# Patient Record
Sex: Female | Born: 1940 | Race: White | Hispanic: No | Marital: Married | Smoking: Never smoker
Health system: Southern US, Community
[De-identification: ages and names within clinical notes are randomized; demographics above are authoritative.]

## PROBLEM LIST (undated history)

## (undated) DIAGNOSIS — E7800 Pure hypercholesterolemia, unspecified: Secondary | ICD-10-CM

## (undated) DIAGNOSIS — R32 Unspecified urinary incontinence: Secondary | ICD-10-CM

## (undated) DIAGNOSIS — M810 Age-related osteoporosis without current pathological fracture: Secondary | ICD-10-CM

## (undated) DIAGNOSIS — D126 Benign neoplasm of colon, unspecified: Secondary | ICD-10-CM

## (undated) DIAGNOSIS — C801 Malignant (primary) neoplasm, unspecified: Secondary | ICD-10-CM

## (undated) DIAGNOSIS — Z960 Presence of urogenital implants: Secondary | ICD-10-CM

## (undated) DIAGNOSIS — R0789 Other chest pain: Secondary | ICD-10-CM

## (undated) HISTORY — PX: BREAST EXCISIONAL BIOPSY: SUR124

## (undated) HISTORY — PX: BREAST BIOPSY: SHX20

## (undated) HISTORY — PX: CATARACT EXTRACTION: SUR2

## (undated) HISTORY — PX: COLONOSCOPY: SHX174

## (undated) HISTORY — PX: TUBAL LIGATION: SHX77

## (undated) HISTORY — DX: Age-related osteoporosis without current pathological fracture: M81.0

---

## 1999-01-01 ENCOUNTER — Other Ambulatory Visit: Admission: RE | Admit: 1999-01-01 | Discharge: 1999-01-01 | Payer: Self-pay

## 2004-07-31 ENCOUNTER — Ambulatory Visit: Payer: Self-pay | Admitting: Internal Medicine

## 2005-08-12 ENCOUNTER — Ambulatory Visit: Payer: Self-pay | Admitting: Internal Medicine

## 2005-09-04 ENCOUNTER — Ambulatory Visit: Payer: Self-pay | Admitting: Internal Medicine

## 2006-09-29 ENCOUNTER — Ambulatory Visit: Payer: Self-pay | Admitting: Internal Medicine

## 2007-11-13 ENCOUNTER — Ambulatory Visit: Payer: Self-pay | Admitting: Internal Medicine

## 2008-07-12 ENCOUNTER — Ambulatory Visit: Payer: Self-pay | Admitting: Surgery

## 2008-11-14 ENCOUNTER — Ambulatory Visit: Payer: Self-pay | Admitting: Family Medicine

## 2009-11-16 ENCOUNTER — Ambulatory Visit: Payer: Self-pay | Admitting: Family Medicine

## 2010-11-20 ENCOUNTER — Ambulatory Visit: Payer: Self-pay | Admitting: Internal Medicine

## 2011-11-26 ENCOUNTER — Ambulatory Visit: Payer: Self-pay | Admitting: Internal Medicine

## 2012-10-15 ENCOUNTER — Ambulatory Visit: Payer: Self-pay | Admitting: Unknown Physician Specialty

## 2012-12-03 ENCOUNTER — Ambulatory Visit: Payer: Self-pay | Admitting: Internal Medicine

## 2013-12-07 ENCOUNTER — Ambulatory Visit: Payer: Self-pay | Admitting: Internal Medicine

## 2014-02-07 DIAGNOSIS — E785 Hyperlipidemia, unspecified: Secondary | ICD-10-CM | POA: Insufficient documentation

## 2014-02-07 DIAGNOSIS — R030 Elevated blood-pressure reading, without diagnosis of hypertension: Secondary | ICD-10-CM | POA: Insufficient documentation

## 2014-12-20 ENCOUNTER — Ambulatory Visit: Payer: Self-pay | Admitting: Pediatrics

## 2015-12-26 ENCOUNTER — Other Ambulatory Visit: Payer: Self-pay | Admitting: Pediatrics

## 2015-12-26 DIAGNOSIS — Z1231 Encounter for screening mammogram for malignant neoplasm of breast: Secondary | ICD-10-CM

## 2015-12-27 ENCOUNTER — Ambulatory Visit
Admission: RE | Admit: 2015-12-27 | Discharge: 2015-12-27 | Disposition: A | Discharge status: Home / Self Care | Payer: Medicare Other | Source: Ambulatory Visit | Attending: Pediatrics | Admitting: Pediatrics

## 2015-12-27 DIAGNOSIS — Z1231 Encounter for screening mammogram for malignant neoplasm of breast: Secondary | ICD-10-CM | POA: Diagnosis not present

## 2015-12-27 HISTORY — DX: Malignant (primary) neoplasm, unspecified: C80.1

## 2016-03-26 ENCOUNTER — Ambulatory Visit: Payer: Self-pay | Admitting: Podiatry

## 2016-04-02 ENCOUNTER — Ambulatory Visit (INDEPENDENT_AMBULATORY_CARE_PROVIDER_SITE_OTHER): Payer: Medicare Other

## 2016-04-02 ENCOUNTER — Ambulatory Visit (INDEPENDENT_AMBULATORY_CARE_PROVIDER_SITE_OTHER): Payer: Medicare Other | Admitting: Podiatry

## 2016-04-02 DIAGNOSIS — M205X9 Other deformities of toe(s) (acquired), unspecified foot: Secondary | ICD-10-CM

## 2016-04-02 DIAGNOSIS — M792 Neuralgia and neuritis, unspecified: Secondary | ICD-10-CM

## 2016-04-02 DIAGNOSIS — R52 Pain, unspecified: Secondary | ICD-10-CM

## 2016-04-02 DIAGNOSIS — B351 Tinea unguium: Secondary | ICD-10-CM

## 2016-04-02 NOTE — Progress Notes (Signed)
   Subjective:    Patient ID: Renee Chen, female    DOB: [birth year], 75 y.o.   MRN: [id number]  HPI  75 year old female presents the office today as she may be appointment originally for toenail fungus. She is in no recent treatment. She does state that her right big toenail to come off last week and denies any drainage or pus or any swelling to the toe. No injury to the toe. She also states that more recently she started noticing numbness to her fourth toe. She previously had this on the left foot and she had a pins nerve and she had surgery. She gets some occasional pain in that area still however. No recent injury or trauma to her feet. No swelling or redness. No recent treatment. No other complaints.  Review of Systems  All other systems reviewed and are negative.      Objective:   Physical Exam General: AAO x3, NAD  Dermatological: Nails appear to be hypertrophic, dystrophic, somewhat discolored except for the right hallux toenail which has recently fallen off and there is underlying healthy skin and no wound present. There is no tenderness the nails there is no surrounding redness or drainage. No open lesion is identified at this time.  Vascular: Dorsalis Pedis artery and Posterior Tibial artery pedal pulses are 2/4 bilateral with immedate capillary fill time. Pedal hair growth present. There is no pain with calf compression, swelling, warmth, erythema.   Neruologic: Grossly intact via light touch bilateral. Vibratory intact via tuning fork bilateral. Protective threshold with Semmes Wienstein monofilament intact to all pedal sites bilateral..   Musculoskeletal: Adductovarus of the fourth and fifth toes bilaterally. There is no palpable neuroma identified at this time there is no pain on the interspaces. She is currently not expansion any numbness or tingling to her toes. There is prominence the metatarsal heads plantarly. No pain, crepitus, or limitation noted with foot and  ankle range of motion bilateral. Muscular strength 5/5 in all groups tested bilateral.  Gait: Unassisted, Nonantalgic.      Assessment & Plan:  75 year old female with right fourth toe numbness likely biomechanical in changes, likely onychomycosis -Treatment options discussed including all alternatives, risks, and complications -Etiology of symptoms were discussed -X-rays were obtained and reviewed with the patient. No evidence of acute fracture. Hammertoe deformity is present. -Offloading pads dispensed to the toe. I am unable to appreciate an neuroma today. If symptoms persist possible ultrasound versus MRI. -Nails were debrided and sent for biopsy to evaluate for onychomycosis. Discussed treatment options however will await results of the culture before proceeding with treatment. She'll likely undergo topical treatment. -Follow-up after nail culture or sooner if any problems arise. In the meantime, encouraged to call the office with any questions, concerns, change in symptoms.   Celesta Gentile, DPM

## 2016-05-09 ENCOUNTER — Ambulatory Visit (INDEPENDENT_AMBULATORY_CARE_PROVIDER_SITE_OTHER): Payer: Medicare Other | Admitting: Podiatry

## 2016-05-09 DIAGNOSIS — B351 Tinea unguium: Secondary | ICD-10-CM

## 2016-05-09 DIAGNOSIS — M205X9 Other deformities of toe(s) (acquired), unspecified foot: Secondary | ICD-10-CM

## 2016-05-09 DIAGNOSIS — R200 Anesthesia of skin: Secondary | ICD-10-CM

## 2016-05-09 DIAGNOSIS — R208 Other disturbances of skin sensation: Secondary | ICD-10-CM

## 2016-05-09 NOTE — Progress Notes (Signed)
Subjective: 75 year old female presents the office they discussed nail culture results. Denies any changes to her toenails and as a painter surrounding redness or drainage. She states the fourth toe numbness has improved. Nothing appeared that she was. She does wear the offloading pads which doesn't help. She has no new concerns today.Denies any systemic complaints such as fevers, chills, nausea, vomiting. No acute changes since last appointment, and no other complaints at this time.   Objective: AAO x3, NAD DP/PT pulses palpable bilaterally, CRT less than 3 seconds Nails are somewhat dystrophic, hypertrophic, discolored. The right hallux toenail which is previously falloff aside to grow back in. There is no tenderness to nails is no redness or drainage or any signs or symptoms of infection. At this time she states there is no subjective numbness the right fourth toe. There is adductovarus the toe. No areas of pinpoint bony tenderness or pain with vibratory sensation. MMT 5/5, ROM WNL. No edema, erythema, increase in warmth to bilateral lower extremities.  No open lesions or pre-ulcerative lesions.  No pain with calf compression, swelling, warmth, erythema  Assessment: Onychomycosis; resolving right fourth toe numbness  Plan: -All treatment options discussed with the patient including all alternatives, risks, complications.  Continue offloading pads to the right fourth toe. Discussed shoe gear changes. His worsen or do not resolve to call the office. -Discussed nail culture results. After discussion in regards treatment options is limited to proceed with topical treatment. Prescribed compound cream for her. Directed on use as well as length of time. Also discuss success rate of this medicine. -Follow-up at her discretion -Patient encouraged to call the office with any questions, concerns, change in symptoms.   Celesta Gentile, DPM

## 2016-06-12 DIAGNOSIS — N812 Incomplete uterovaginal prolapse: Secondary | ICD-10-CM | POA: Insufficient documentation

## 2016-11-28 ENCOUNTER — Other Ambulatory Visit: Payer: Self-pay | Admitting: Family

## 2016-11-28 DIAGNOSIS — Z1231 Encounter for screening mammogram for malignant neoplasm of breast: Secondary | ICD-10-CM

## 2016-12-31 ENCOUNTER — Ambulatory Visit
Admission: RE | Admit: 2016-12-31 | Discharge: 2016-12-31 | Disposition: A | Discharge status: Home / Self Care | Payer: Medicare Other | Source: Ambulatory Visit | Attending: Family | Admitting: Family

## 2016-12-31 DIAGNOSIS — Z1231 Encounter for screening mammogram for malignant neoplasm of breast: Secondary | ICD-10-CM | POA: Insufficient documentation

## 2017-02-18 ENCOUNTER — Ambulatory Visit (INDEPENDENT_AMBULATORY_CARE_PROVIDER_SITE_OTHER): Payer: Medicare Other | Admitting: Podiatry

## 2017-02-18 DIAGNOSIS — B351 Tinea unguium: Secondary | ICD-10-CM | POA: Diagnosis not present

## 2017-02-20 NOTE — Progress Notes (Signed)
   Subjective: Patient presents today for possible treatment and evaluation of fungal nails bilaterally of toes 2 and 4 of the right foot. Patient states that the nails have been discolored and thickened for greater than 1 month. She is requesting a refill on Lamisil and states it has been helping to improve the fungus. Patient presents today for further treatment and evaluation.  Objective: Physical Exam General: The patient is alert and oriented x3 in no acute distress.  Dermatology: Hyperkeratotic, discolored, thickened, onychodystrophy of nails noted bilaterally.  Skin is warm, dry and supple bilateral lower extremities. Negative for open lesions or macerations.  Vascular: Palpable pedal pulses bilaterally. No edema or erythema noted. Capillary refill within normal limits.  Neurological: Epicritic and protective threshold grossly intact bilaterally.   Musculoskeletal Exam: Range of motion within normal limits to all pedal and ankle joints bilateral. Muscle strength 5/5 in all groups bilateral.   Assessment: #1 onychodystrophy bilateral toenails #2 possible onychomycosis #3 hyperkeratotic nails bilateral  Plan of Care:  #1 Patient was evaluated. #2 Discussed different treatment options. #3 Pt opts for no treatment at this time.  #4 Return to clinic as necessary.   Edrick Kins, DPM [facility]  Dr. Edrick Kins, [address]                                        [place], [place] [address]                Office [phone]  Fax [phone]

## 2017-08-06 DIAGNOSIS — Z960 Presence of urogenital implants: Secondary | ICD-10-CM | POA: Insufficient documentation

## 2017-11-27 ENCOUNTER — Other Ambulatory Visit: Payer: Self-pay | Admitting: Family

## 2017-11-27 DIAGNOSIS — Z1231 Encounter for screening mammogram for malignant neoplasm of breast: Secondary | ICD-10-CM

## 2018-01-01 ENCOUNTER — Encounter (INDEPENDENT_AMBULATORY_CARE_PROVIDER_SITE_OTHER): Payer: Self-pay

## 2018-01-01 ENCOUNTER — Ambulatory Visit
Admission: RE | Admit: 2018-01-01 | Discharge: 2018-01-01 | Disposition: A | Discharge status: Home / Self Care | Payer: Medicare Other | Source: Ambulatory Visit | Attending: Family | Admitting: Family

## 2018-01-01 DIAGNOSIS — Z1231 Encounter for screening mammogram for malignant neoplasm of breast: Secondary | ICD-10-CM | POA: Diagnosis not present

## 2018-11-26 ENCOUNTER — Other Ambulatory Visit: Payer: Self-pay | Admitting: Family

## 2018-11-26 DIAGNOSIS — Z1231 Encounter for screening mammogram for malignant neoplasm of breast: Secondary | ICD-10-CM

## 2019-01-04 ENCOUNTER — Ambulatory Visit: Payer: Medicare Other

## 2019-02-15 ENCOUNTER — Ambulatory Visit: Payer: Medicare Other

## 2019-04-15 ENCOUNTER — Other Ambulatory Visit: Payer: Self-pay

## 2019-04-15 ENCOUNTER — Ambulatory Visit
Admission: RE | Admit: 2019-04-15 | Discharge: 2019-04-15 | Disposition: A | Discharge status: Home / Self Care | Payer: Medicare Other | Source: Ambulatory Visit | Attending: Family | Admitting: Family

## 2019-04-15 DIAGNOSIS — Z1231 Encounter for screening mammogram for malignant neoplasm of breast: Secondary | ICD-10-CM | POA: Diagnosis not present

## 2019-05-28 DIAGNOSIS — Z860101 Personal history of adenomatous and serrated colon polyps: Secondary | ICD-10-CM | POA: Insufficient documentation

## 2019-06-11 ENCOUNTER — Ambulatory Visit (INDEPENDENT_AMBULATORY_CARE_PROVIDER_SITE_OTHER): Payer: Medicare Other | Admitting: Podiatry

## 2019-06-11 ENCOUNTER — Encounter: Payer: Self-pay | Admitting: Podiatry

## 2019-06-11 ENCOUNTER — Other Ambulatory Visit: Payer: Self-pay

## 2019-06-11 DIAGNOSIS — B351 Tinea unguium: Secondary | ICD-10-CM | POA: Diagnosis not present

## 2019-06-13 NOTE — Progress Notes (Signed)
   Subjective: 78 y.o. female presenting today for follow up evaluation of nail fungus of the right foot that has been ongoing for the past several years. She states she was treated with with a medication a couple of years ago that provided no significant relief. She has not had any recent treatment. She denies any modifying factors. Patient is here for further evaluation and treatment.   Past Medical History:  Diagnosis Date  . 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  Dermatology: Hyperkeratotic, discolored, thickened, onychodystrophy noted to nails 1 and 2 of the right foot. Skin is warm, dry and supple bilateral lower extremities. Negative for open lesions or macerations.  Vascular: Palpable pedal pulses bilaterally. No edema or erythema noted. Capillary refill within normal limits.  Neurological: Epicritic and protective threshold grossly intact bilaterally.   Musculoskeletal Exam: Range of motion within normal limits to all pedal and ankle joints bilateral. Muscle strength 5/5 in all groups bilateral.   Assessment: #1 Onychomycosis nails 1, 2 right #2 Hyperkeratotic nails 1, 2 right  Plan of Care:  #1 Patient was evaluated. #2 Recommended OTC topical Formula 7 treatment.  #3 Recommended good shoe gear.  #4 Patient declined oral or laser treatment.  #5 Return to clinic as needed.    Edrick Kins, DPM [facility]  Dr. Edrick Kins, [address]                                        [place], [place] [address]                Office [phone]  Fax [phone]

## 2019-07-15 LAB — HM COLONOSCOPY

## 2019-07-30 ENCOUNTER — Other Ambulatory Visit: Payer: Self-pay

## 2019-07-30 ENCOUNTER — Other Ambulatory Visit
Admission: RE | Admit: 2019-07-30 | Discharge: 2019-07-30 | Disposition: A | Discharge status: Home / Self Care | Payer: Medicare Other | Source: Ambulatory Visit | Attending: Internal Medicine | Admitting: Internal Medicine

## 2019-07-30 DIAGNOSIS — Z20828 Contact with and (suspected) exposure to other viral communicable diseases: Secondary | ICD-10-CM | POA: Diagnosis present

## 2019-07-30 LAB — SARS CORONAVIRUS 2 (TAT 6-24 HRS): SARS Coronavirus 2: NEGATIVE

## 2019-08-04 ENCOUNTER — Other Ambulatory Visit: Payer: Self-pay

## 2019-08-04 ENCOUNTER — Ambulatory Visit: Payer: Medicare Other | Admitting: Certified Registered"

## 2019-08-04 ENCOUNTER — Ambulatory Visit
Admission: RE | Admit: 2019-08-04 | Discharge: 2019-08-04 | Disposition: A | Discharge status: Home / Self Care | Payer: Medicare Other | Attending: Internal Medicine | Admitting: Internal Medicine

## 2019-08-04 ENCOUNTER — Encounter
Admission: RE | Disposition: A | Discharge status: Home / Self Care | Payer: Self-pay | Source: Home / Self Care | Attending: Internal Medicine

## 2019-08-04 ENCOUNTER — Encounter: Payer: Self-pay | Admitting: Anesthesiology

## 2019-08-04 DIAGNOSIS — Z79899 Other long term (current) drug therapy: Secondary | ICD-10-CM | POA: Insufficient documentation

## 2019-08-04 DIAGNOSIS — K5730 Diverticulosis of large intestine without perforation or abscess without bleeding: Secondary | ICD-10-CM | POA: Diagnosis not present

## 2019-08-04 DIAGNOSIS — Z86010 Personal history of colonic polyps: Secondary | ICD-10-CM | POA: Diagnosis not present

## 2019-08-04 DIAGNOSIS — Z1211 Encounter for screening for malignant neoplasm of colon: Secondary | ICD-10-CM | POA: Insufficient documentation

## 2019-08-04 DIAGNOSIS — K640 First degree hemorrhoids: Secondary | ICD-10-CM | POA: Diagnosis not present

## 2019-08-04 HISTORY — DX: Unspecified urinary incontinence: R32

## 2019-08-04 HISTORY — DX: Benign neoplasm of colon, unspecified: D12.6

## 2019-08-04 HISTORY — DX: Other chest pain: R07.89

## 2019-08-04 HISTORY — DX: Presence of urogenital implants: Z96.0

## 2019-08-04 HISTORY — DX: Pure hypercholesterolemia, unspecified: E78.00

## 2019-08-04 HISTORY — PX: COLONOSCOPY WITH PROPOFOL: SHX5780

## 2019-08-04 SURGERY — COLONOSCOPY WITH PROPOFOL
Anesthesia: General

## 2019-08-04 MED ORDER — LIDOCAINE HCL (PF) 2 % IJ SOLN
INTRAMUSCULAR | Status: DC | PRN
  Administered 2019-08-04: 60 mg via INTRADERMAL

## 2019-08-04 MED ORDER — PROPOFOL 500 MG/50ML IV EMUL
INTRAVENOUS | Status: DC | PRN
  Administered 2019-08-04: 150 ug/kg/min via INTRAVENOUS

## 2019-08-04 MED ORDER — GLYCOPYRROLATE 0.2 MG/ML IJ SOLN
INTRAMUSCULAR | Status: DC | PRN
  Administered 2019-08-04: 0.1 mg via INTRAVENOUS

## 2019-08-04 MED ORDER — PROPOFOL 500 MG/50ML IV EMUL
INTRAVENOUS | Status: AC
  Filled 2019-08-04: qty 50

## 2019-08-04 MED ORDER — PROPOFOL 10 MG/ML IV BOLUS
INTRAVENOUS | Status: DC | PRN
  Administered 2019-08-04 (×2): 20 mg via INTRAVENOUS

## 2019-08-04 MED ORDER — SODIUM CHLORIDE 0.9 % IV SOLN
INTRAVENOUS | Status: DC
  Administered 2019-08-04: 1000 mL via INTRAVENOUS

## 2019-08-04 NOTE — Anesthesia Post-op Follow-up Note (Signed)
Anesthesia QCDR form completed.        

## 2019-08-04 NOTE — Anesthesia Preprocedure Evaluation (Signed)
Anesthesia Evaluation  Patient identified by MRN, date of birth, ID band Patient awake    Reviewed: Allergy & Precautions, NPO status , Patient's Chart, lab work & pertinent test results  Airway Mallampati: III       Dental   Pulmonary neg pulmonary ROS,    Pulmonary exam normal        Cardiovascular negative cardio ROS Normal cardiovascular exam     Neuro/Psych negative neurological ROS  negative psych ROS   GI/Hepatic Neg liver ROS,   Endo/Other  negative endocrine ROS  Renal/GU negative Renal ROS  negative genitourinary   Musculoskeletal negative musculoskeletal ROS (+)   Abdominal Normal abdominal exam  (+)   Peds negative pediatric ROS (+)  Hematology negative hematology ROS (+)   Anesthesia Other Findings Past Medical History: No date: Adenomatous colon polyp No date: Atypical chest pain No date: Cancer ([facility])     Comment:  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 No date: Presence of pessary No date: Pure hypercholesterolemia No date: Urinary incontinence  Reproductive/Obstetrics                             Anesthesia Physical Anesthesia Plan  ASA: II  Anesthesia Plan: General   Post-op Pain Management:    Induction: Intravenous  PONV Risk Score and Plan: TIVA  Airway Management Planned: Nasal Cannula  Additional Equipment:   Intra-op Plan:   Post-operative Plan:   Informed Consent: I have reviewed the patients History and Physical, chart, labs and discussed the procedure including the risks, benefits and alternatives for the proposed anesthesia with the patient or authorized representative who has indicated his/her understanding and acceptance.     Dental advisory given  Plan Discussed with: CRNA  Anesthesia Plan Comments:         Anesthesia Quick Evaluation

## 2019-08-04 NOTE — Interval H&P Note (Signed)
History and Physical Interval Note:  08/04/2019 8:51 AM  Renee Chen  has presented today for surgery, with the diagnosis of History of Adenomatous Colonic Polyps.  The various methods of treatment have been discussed with the patient and family. After consideration of risks, benefits and other options for treatment, the patient has consented to  Procedure(s): COLONOSCOPY WITH PROPOFOL (N/A) as a surgical intervention.  The patient's history has been reviewed, patient examined, no change in status, stable for surgery.  I have reviewed the patient's chart and labs.  Questions were answered to the patient's satisfaction.     [place], [place]

## 2019-08-04 NOTE — Anesthesia Postprocedure Evaluation (Signed)
Anesthesia Post Note  Patient: Renee Chen  Procedure(s) Performed: COLONOSCOPY WITH PROPOFOL (N/A )  Patient location during evaluation: Endoscopy Anesthesia Type: General Level of consciousness: awake and alert and oriented Pain management: pain level controlled Vital Signs Assessment: post-procedure vital signs reviewed and stable Respiratory status: spontaneous breathing Cardiovascular status: blood pressure returned to baseline Anesthetic complications: no     Last Vitals:  Vitals:   08/04/19 0923 08/04/19 0933  BP: 119/71 120/78  Pulse: 86 76  Resp: 17 15  Temp:    SpO2: 100% 100%    Last Pain:  Vitals:   08/04/19 0933  TempSrc:   PainSc: 0-No pain                 Trejuan Matherne

## 2019-08-04 NOTE — Transfer of Care (Signed)
Immediate Anesthesia Transfer of Care Note  Patient: Renee Chen  Procedure(s) Performed: COLONOSCOPY WITH PROPOFOL (N/A )  Patient Location: PACU  Anesthesia Type:MAC  Level of Consciousness: drowsy and patient cooperative  Airway & Oxygen Therapy: Patient Spontanous Breathing and Patient connected to nasal cannula oxygen  Post-op Assessment: Report given to RN and Post -op Vital signs reviewed and stable  Post vital signs: Reviewed and stable  Last Vitals:  Vitals Value Taken Time  BP 104/54 08/04/19 0914  Temp 36.1 C 08/04/19 0913  Pulse 77 08/04/19 0914  Resp 13 08/04/19 0914  SpO2 100 % 08/04/19 0914  Vitals shown include unvalidated device data.  Last Pain:  Vitals:   08/04/19 0913  TempSrc: Tympanic  PainSc:          Complications: No apparent anesthesia complications

## 2019-08-04 NOTE — Op Note (Signed)
[facility] Gastroenterology Patient Name: Renee Chen Procedure Date: 08/04/2019 8:56 AM MRN: [id number] Account #: [id number] Date of Birth: November 19, 1940 Admit Type: Outpatient Age: 78 Room: [facility] ENDO ROOM 3 Gender: Female Note Status: Finalized Procedure:            Colonoscopy Indications:          Surveillance: Personal history of adenomatous polyps on                        last colonoscopy > 5 years ago, High risk colon cancer                        surveillance: Personal history of adenoma less than 10                        mm in size, High risk colon cancer surveillance:                        Personal history of sessile serrated colon polyp (less                        than 10 mm in size) with no dysplasia Providers:            Benay Pike. Alice Reichert MD, MD Referring MD:         Warren Danes (Referring MD) Medicines:            Propofol per Anesthesia Complications:        No immediate complications. Procedure:            Pre-Anesthesia Assessment:                       - The risks and benefits of the procedure and the                        sedation options and risks were discussed with the                        patient. All questions were answered and informed                        consent was obtained.                       - Patient identification and proposed procedure were                        verified prior to the procedure by the nurse. The                        procedure was verified in the procedure room.                       - ASA Grade Assessment: III - A patient with severe                        systemic disease.                       - After reviewing the risks and benefits, the patient  was deemed in satisfactory condition to undergo the                        procedure.                       After obtaining informed consent, the colonoscope was                        passed under direct vision.  Throughout the procedure,                        the patient's blood pressure, pulse, and oxygen                        saturations were monitored continuously. The                        Colonoscope was introduced through the anus and                        advanced to the the cecum, identified by appendiceal                        orifice and ileocecal valve. The colonoscopy was                        performed without difficulty. The patient tolerated the                        procedure well. The quality of the bowel preparation                        was excellent. The ileocecal valve, appendiceal                        orifice, and rectum were photographed. Findings:      The perianal and digital rectal examinations were normal. Pertinent       negatives include normal sphincter tone and no palpable rectal lesions.      Multiple small and large-mouthed diverticula were found in the sigmoid       colon.      Internal hemorrhoids were found during retroflexion. The hemorrhoids       were Grade I (internal hemorrhoids that do not prolapse).      The exam was otherwise without abnormality. Impression:           - Diverticulosis in the sigmoid colon.                       - Internal hemorrhoids.                       - The examination was otherwise normal.                       - No specimens collected. Recommendation:       - Patient has a contact number available for                        emergencies. The signs and symptoms of potential  delayed complications were discussed with the patient.                        Return to normal activities tomorrow. Written discharge                        instructions were provided to the patient.                       - Resume previous diet.                       - Continue present medications.                       - No repeat colonoscopy due to current age (73 years or                        older) and the absence of  colonic polyps.                       - Return to GI office PRN.                       - The findings and recommendations were discussed with                        the patient. Procedure Code(s):    --- Professional ---                       [id number], Colorectal cancer screening; colonoscopy on                        individual at high risk Diagnosis Code(s):    --- Professional ---                       K57.30, Diverticulosis of large intestine without                        perforation or abscess without bleeding                       Z86.010, Personal history of colonic polyps                       K64.0, First degree hemorrhoids CPT copyright 2019 American Medical Association. All rights reserved. The codes documented in this report are preliminary and upon coder review may  be revised to meet current compliance requirements. Efrain Sella MD, MD 08/04/2019 9:12:35 AM This report has been signed electronically. Number of Addenda: 0 Note Initiated On: 08/04/2019 8:56 AM Scope Withdrawal Time: 0 hours 6 minutes 11 seconds  Total Procedure Duration: 0 hours 8 minutes 58 seconds  Estimated Blood Loss: Estimated blood loss: none.      [facility]

## 2019-08-04 NOTE — H&P (Signed)
Outpatient short stay form Pre-procedure 08/04/2019 8:50 AM Mikhi Athey K. Alice Reichert, M.D.  Primary Physician: Verita Lamb, NP  Reason for visit:  JPersonal hx of adenomatous colon polyps (Tubular adenoma x 1, Sessile serrated adenoma x 1) - Colonoscop 10/15/2012  History of present illness:                            Patient presents for colonoscopy for a personal hx of colon polyps. The patient denies abdominal pain, abnormal weight loss or rectal bleeding.     Current Facility-Administered Medications:  .  0.9 %  sodium chloride infusion, , Intravenous, Continuous, [place], Benay Pike, MD, Last Rate: 20 mL/hr at 08/04/19 0814, 1,000 mL at 08/04/19 [id number]  Medications Prior to Admission  Medication Sig Dispense Refill Last Dose  . CHOLECALCIFEROL PO Take 1,000 Units by mouth.   Past Week at Unknown time  . MAGNESIUM-OXIDE PO Take 300 capsules by mouth.   Past Week at Unknown time  . Multiple Vitamins-Minerals (CENTRUM ULTRA WOMENS PO) Take by mouth.   Past Week at Unknown time  . Probiotic Product (4X PROBIOTIC PO) Take by mouth.   Past Week at Unknown time     No Known Allergies   Past Medical History:  Diagnosis Date  . Adenomatous colon polyp   . Atypical chest pain   . 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 Presence of pessary   . Pure hypercholesterolemia   . Urinary incontinence     Review of systems:  Otherwise negative.    Physical Exam  Gen: Alert, oriented. Appears stated age.  HEENT: [place]/AT. PERRLA. Lungs: CTA, no wheezes. CV: RR nl S1, S2. Abd: soft, benign, no masses. BS+ Ext: No edema. Pulses 2+    Planned procedures: Proceed with colonoscopy. The patient understands the nature of the planned procedure, indications, risks, alternatives and potential complications including but not limited to bleeding, infection, perforation, damage to internal organs and possible oversedation/side effects from anesthesia. The patient agrees and gives consent to  proceed.  Please refer to procedure notes for findings, recommendations and patient disposition/instructions.     Callyn Severtson K. Alice Reichert, M.D. Gastroenterology 08/04/2019  8:50 AM

## 2019-11-12 ENCOUNTER — Other Ambulatory Visit: Payer: Self-pay

## 2019-11-12 ENCOUNTER — Ambulatory Visit: Payer: Medicare PPO | Admitting: Podiatry

## 2019-11-12 DIAGNOSIS — M79676 Pain in unspecified toe(s): Secondary | ICD-10-CM

## 2019-11-12 DIAGNOSIS — B351 Tinea unguium: Secondary | ICD-10-CM

## 2019-11-12 DIAGNOSIS — L603 Nail dystrophy: Secondary | ICD-10-CM

## 2019-11-17 NOTE — Progress Notes (Signed)
   SUBJECTIVE 79 year old female presenting today with a chief complaint of a cracked right great toenail that occurred one week ago. She denies any known trauma or injury. She denies any pain. She has not done anything for treatment and denies modifying factors. Patient is here for further evaluation and treatment.   Past Medical History:  Diagnosis Date  . Adenomatous colon polyp   . Atypical chest pain   . 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 Presence of pessary   . Pure hypercholesterolemia   . Urinary incontinence     OBJECTIVE General Patient is awake, alert, and oriented x 3 and in no acute distress. Derm Skin is dry and supple bilateral. Negative open lesions or macerations. Remaining integument unremarkable. Nails are tender, long, thickened and dystrophic with subungual debris, consistent with onychomycosis, 1-5 bilateral. No signs of infection noted. Vasc  DP and PT pedal pulses palpable bilaterally. Temperature gradient within normal limits.  Neuro Epicritic and protective threshold sensation grossly intact bilaterally.  Musculoskeletal Exam No symptomatic pedal deformities noted bilateral. Muscular strength within normal limits.  ASSESSMENT 1. Onychodystrophic nails 1-5 right with hyperkeratosis of nails.  2. Onychomycosis of nail due to dermatophyte right   PLAN OF CARE 1. Patient evaluated today.  2. Instructed to maintain good pedal hygiene and foot care.  3. Mechanical debridement of nails 1-5 right performed using a nail nipper. Filed with dremel without incident.  4. Continue using OTC Formula 3 topical antifungal solution.  5. Return to clinic as needed.     Edrick Kins, DPM [facility]  Dr. Edrick Kins, [address]                                        [place], [place] [address]                Office [phone]  Fax [phone]

## 2020-03-07 ENCOUNTER — Other Ambulatory Visit: Payer: Self-pay | Admitting: Family

## 2020-03-07 DIAGNOSIS — Z1231 Encounter for screening mammogram for malignant neoplasm of breast: Secondary | ICD-10-CM

## 2020-04-18 ENCOUNTER — Ambulatory Visit
Admission: RE | Admit: 2020-04-18 | Discharge: 2020-04-18 | Disposition: A | Discharge status: Home / Self Care | Payer: Medicare PPO | Source: Ambulatory Visit | Attending: Family | Admitting: Family

## 2020-04-18 ENCOUNTER — Encounter (INDEPENDENT_AMBULATORY_CARE_PROVIDER_SITE_OTHER): Payer: Self-pay

## 2020-04-18 ENCOUNTER — Other Ambulatory Visit: Payer: Self-pay

## 2020-04-18 DIAGNOSIS — Z1231 Encounter for screening mammogram for malignant neoplasm of breast: Secondary | ICD-10-CM

## 2020-11-20 DIAGNOSIS — H903 Sensorineural hearing loss, bilateral: Secondary | ICD-10-CM | POA: Insufficient documentation

## 2021-03-09 ENCOUNTER — Other Ambulatory Visit: Payer: Self-pay | Admitting: Family

## 2021-03-09 DIAGNOSIS — Z1231 Encounter for screening mammogram for malignant neoplasm of breast: Secondary | ICD-10-CM

## 2021-04-19 ENCOUNTER — Other Ambulatory Visit: Payer: Self-pay

## 2021-04-19 ENCOUNTER — Ambulatory Visit
Admission: RE | Admit: 2021-04-19 | Discharge: 2021-04-19 | Disposition: A | Discharge status: Home / Self Care | Payer: Medicare PPO | Source: Ambulatory Visit | Attending: Family | Admitting: Family

## 2021-04-19 DIAGNOSIS — Z1231 Encounter for screening mammogram for malignant neoplasm of breast: Secondary | ICD-10-CM | POA: Insufficient documentation

## 2021-04-30 DIAGNOSIS — Z85828 Personal history of other malignant neoplasm of skin: Secondary | ICD-10-CM | POA: Insufficient documentation

## 2021-05-27 IMAGING — MG DIGITAL SCREENING BILATERAL MAMMOGRAM WITH TOMO AND CAD
8 series · 8 of 24 positions shown · non-contrast
Comparison: Previous exam(s).

CLINICAL DATA: Screening.

EXAM:
DIGITAL SCREENING BILATERAL MAMMOGRAM WITH TOMO AND CAD

[L CC synth-2D]
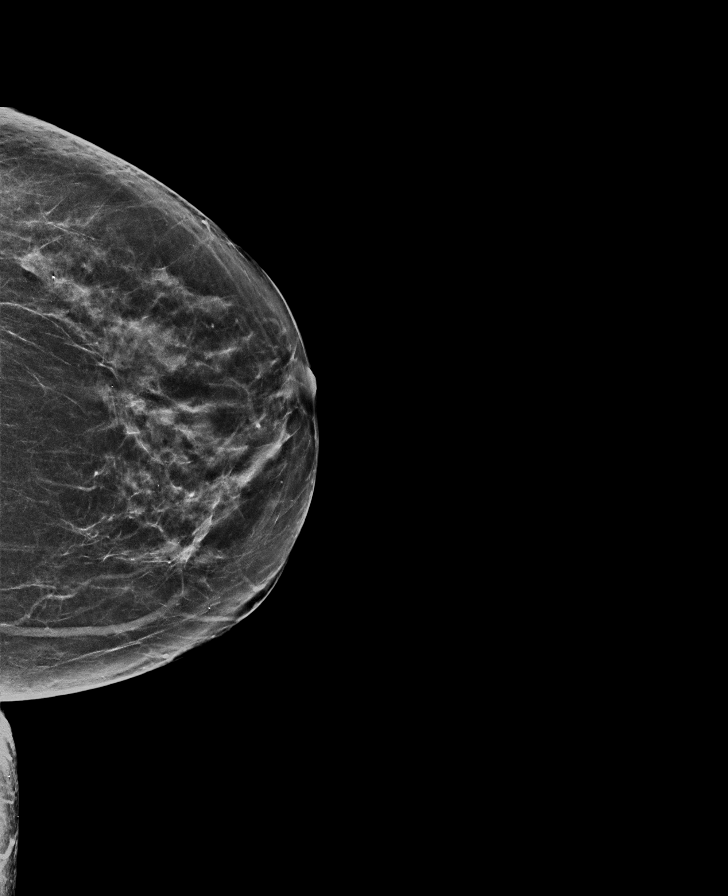

[L MLO synth-2D]
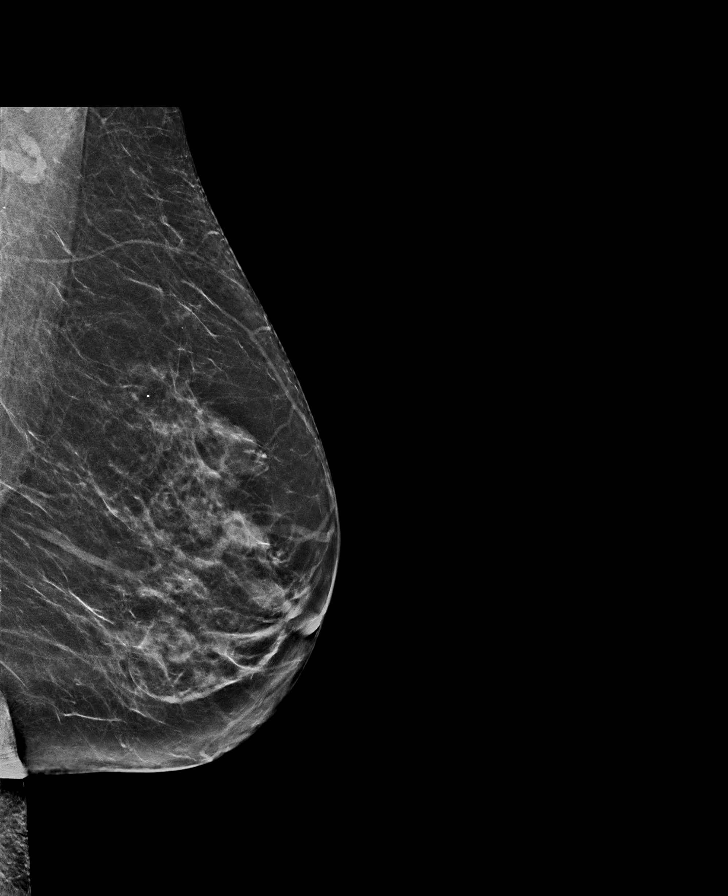

[R MLO synth-2D]
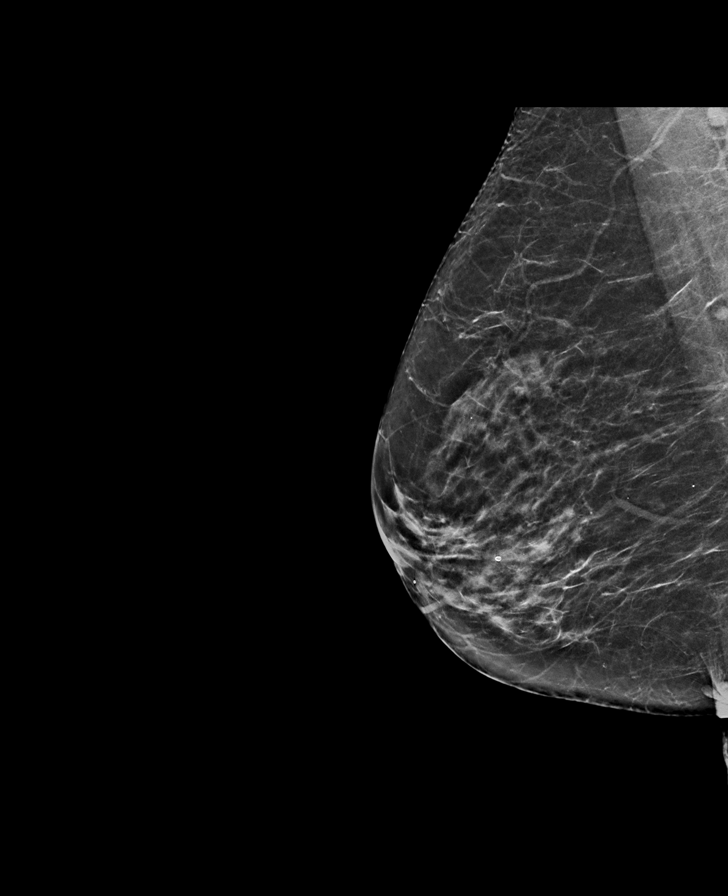

[R CC synth-2D]
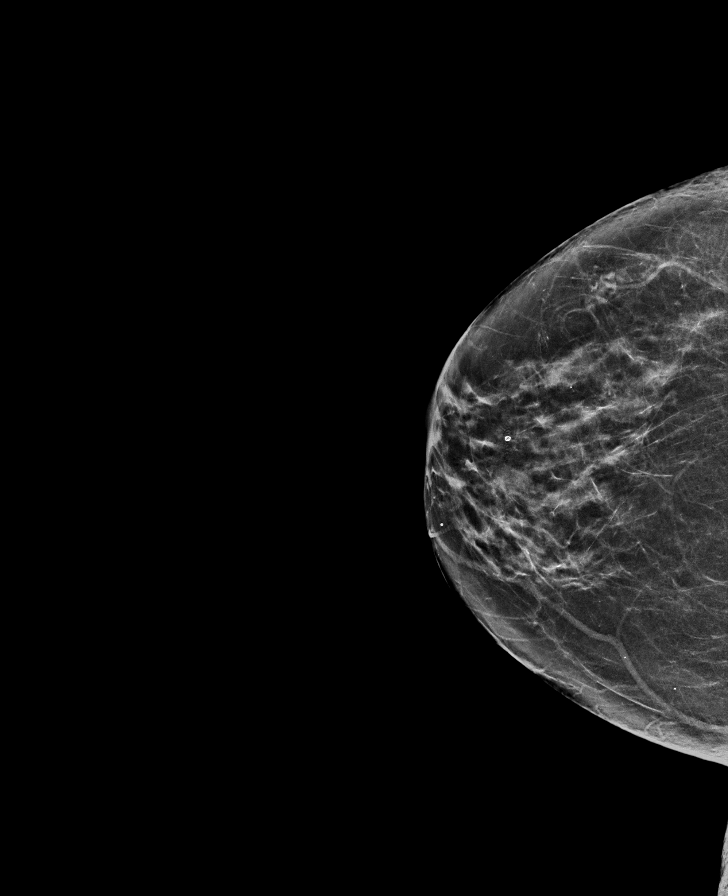

[L CC tomo · tomo slice 32/63.0]
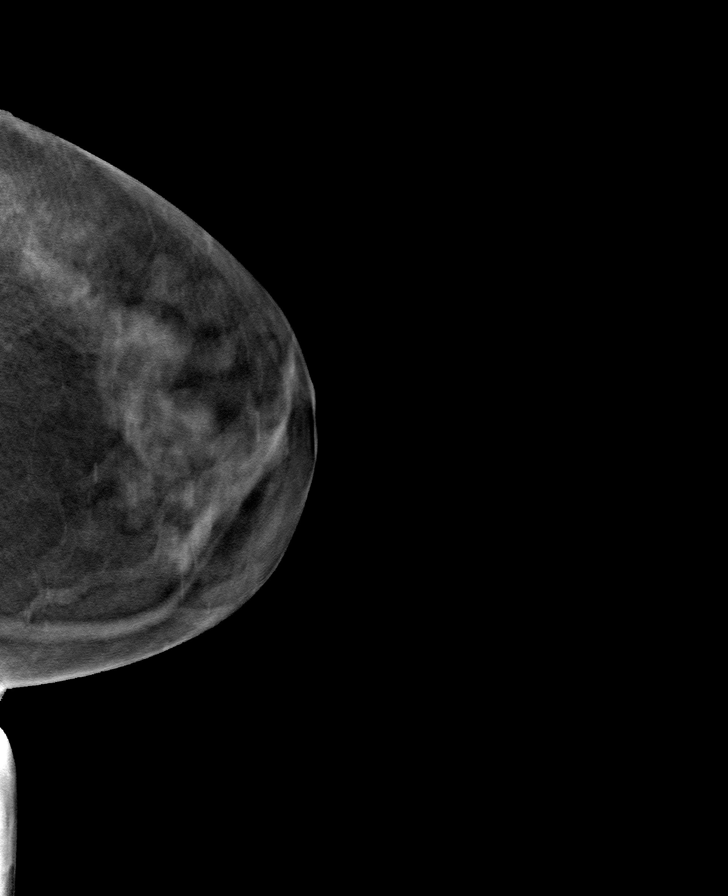

[R MLO tomo · tomo slice 32/63.0]
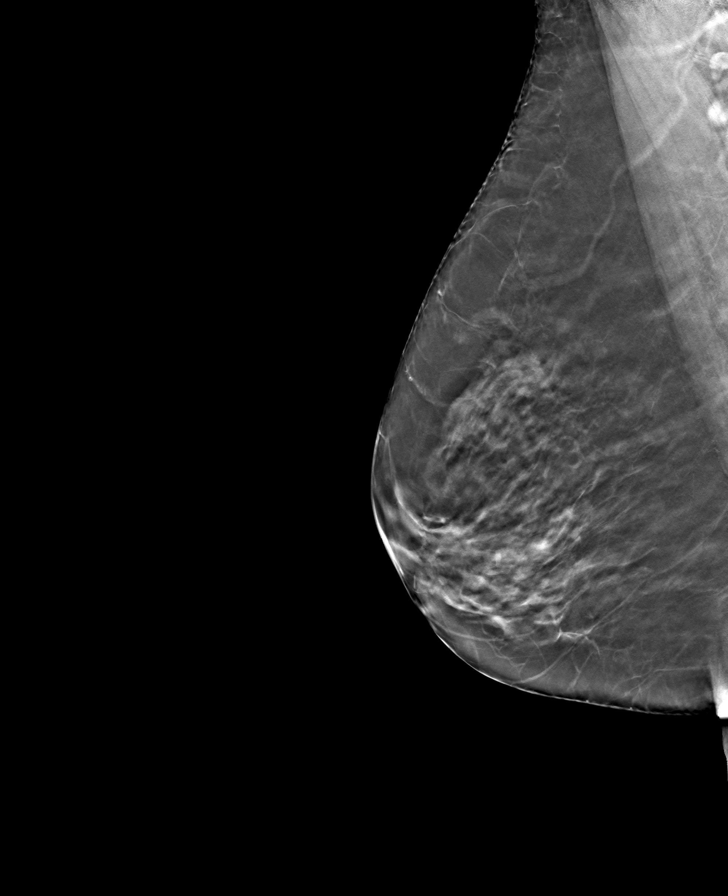

[L MLO tomo · tomo slice 31/61.0]
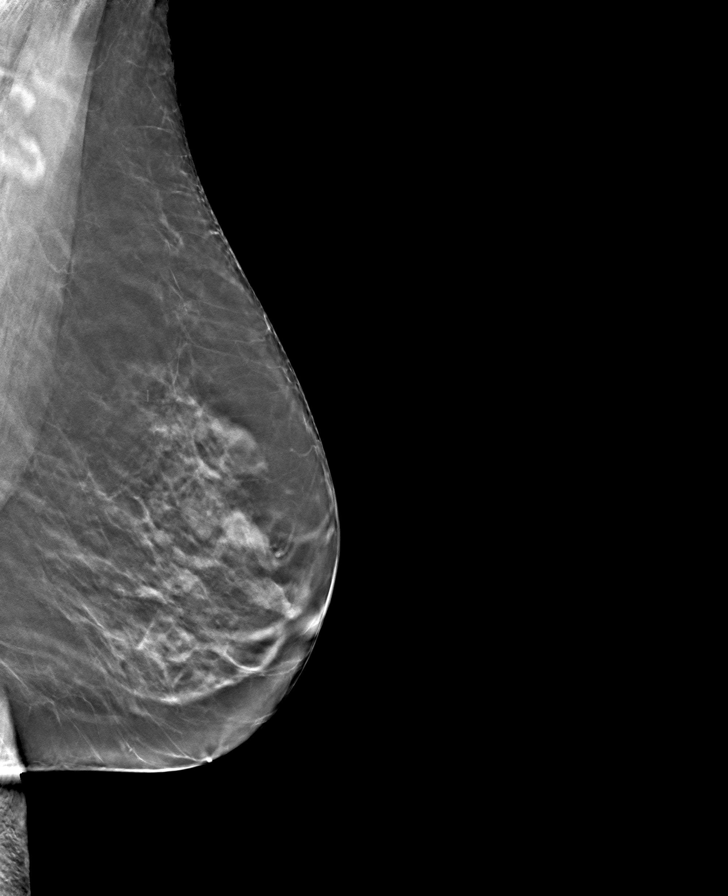

[R CC tomo · tomo slice 32/63.0]
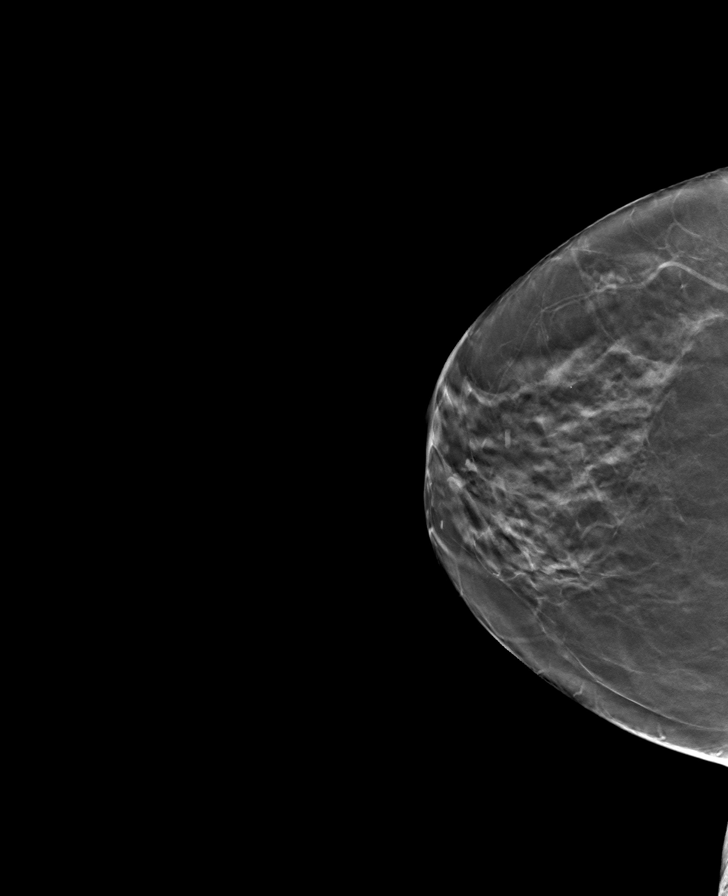

[8 of 24 positions shown; findings below may reference images not displayed]

ACR Breast Density Category c: The breast tissue is heterogeneously
dense, which may obscure small masses.
FINDINGS: There are no findings suspicious for malignancy. Images were
processed with CAD.
IMPRESSION: No mammographic evidence of malignancy. A result letter of this
screening mammogram will be mailed directly to the patient.

RECOMMENDATION:
Screening mammogram in one year. (Code:FT-U-LHB)

BI-RADS CATEGORY  1: Negative.

## 2021-07-02 DIAGNOSIS — M545 Low back pain, unspecified: Secondary | ICD-10-CM | POA: Insufficient documentation

## 2021-07-14 DIAGNOSIS — M7062 Trochanteric bursitis, left hip: Secondary | ICD-10-CM | POA: Insufficient documentation

## 2022-03-12 ENCOUNTER — Other Ambulatory Visit: Payer: Self-pay | Admitting: Family

## 2022-03-12 DIAGNOSIS — Z1231 Encounter for screening mammogram for malignant neoplasm of breast: Secondary | ICD-10-CM

## 2022-04-22 ENCOUNTER — Ambulatory Visit
Admission: RE | Admit: 2022-04-22 | Discharge: 2022-04-22 | Disposition: A | Discharge status: Home / Self Care | Payer: Medicare PPO | Source: Ambulatory Visit | Attending: Family | Admitting: Family

## 2022-04-22 DIAGNOSIS — Z1231 Encounter for screening mammogram for malignant neoplasm of breast: Secondary | ICD-10-CM | POA: Diagnosis present

## 2022-05-14 LAB — HM DEXA SCAN

## 2022-05-30 ENCOUNTER — Other Ambulatory Visit: Payer: Self-pay | Admitting: Family

## 2022-05-30 DIAGNOSIS — Z780 Asymptomatic menopausal state: Secondary | ICD-10-CM

## 2022-06-15 ENCOUNTER — Encounter: Payer: Self-pay | Admitting: Emergency Medicine

## 2022-06-15 ENCOUNTER — Other Ambulatory Visit: Payer: Self-pay

## 2022-06-15 ENCOUNTER — Ambulatory Visit
Admission: EM | Admit: 2022-06-15 | Discharge: 2022-06-15 | Disposition: A | Discharge status: Home / Self Care | Payer: Medicare PPO

## 2022-06-15 DIAGNOSIS — Z20822 Contact with and (suspected) exposure to covid-19: Secondary | ICD-10-CM

## 2022-06-15 NOTE — ED Triage Notes (Signed)
Patient reports spouse with positive covid test today.  Patient reports ears feeling stuffy, started today.  Patient did not do a covid test.

## 2022-06-15 NOTE — Discharge Instructions (Signed)
Test your self if you develop symptoms

## 2022-06-15 NOTE — ED Provider Notes (Signed)
[facility]    CSN: [id number] Arrival date & time: 06/15/22  1204      History   Chief Complaint Chief Complaint  Patient presents with   covid exposure    HPI Renee Chen is a 81 y.o. female with mild ear aches off and on, and no other symptoms. Her husband tested positive for covid today and she is around him all the time. She declined testing here.     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   Past Surgical History:  Procedure Laterality Date   BREAST BIOPSY Left    neg   BREAST EXCISIONAL BIOPSY Left    CATARACT EXTRACTION     COLONOSCOPY     COLONOSCOPY WITH PROPOFOL N/A 08/04/2019   Procedure: COLONOSCOPY WITH PROPOFOL;  Surgeon: Toledo, Benay Pike, MD;  Location: [facility] ENDOSCOPY;  Service: Gastroenterology;  Laterality: N/A;   TUBAL LIGATION      OB History   No obstetric history on file.      Home Medications    Prior to Admission medications   Medication Sig Start Date End Date Taking? Authorizing Provider  atorvastatin (LIPITOR) 10 MG tablet Take 10 mg by mouth daily. 05/03/22   [provider]  CHOLECALCIFEROL PO Take 1,000 Units by mouth.    [provider]  MAGNESIUM-OXIDE PO Take 300 capsules by mouth.    [provider]  Multiple Vitamins-Minerals (CENTRUM ULTRA WOMENS PO) Take by mouth.    [provider]  Probiotic Product (4X PROBIOTIC PO) Take by mouth.    [provider]    Family History Family History  Problem Relation Age of Onset   Breast cancer Mother 37    Social History Social History   Tobacco Use   Smoking status: Never   Smokeless tobacco: Never  Vaping Use   Vaping Use: Never used  Substance Use Topics   Alcohol  use: Not Currently   Drug use: Never     Allergies   Patient has no known allergies.   Review of Systems Review of Systems  Constitutional:  Negative for fatigue and fever.  HENT:  Positive for ear pain. Negative for congestion and ear discharge.   Respiratory:  Negative for cough.   Musculoskeletal:  Negative for myalgias.  Neurological:  Negative for headaches.     Physical Exam Triage Vital Signs ED Triage Vitals  Enc Vitals Group     BP 06/15/22 1336 (!) 146/90     Pulse Rate 06/15/22 1336 84     Resp 06/15/22 1336 18     Temp 06/15/22 1336 98.8 F (37.1 C)     Temp Source 06/15/22 1336 Oral     SpO2 06/15/22 1336 98 %     Weight --      Height --      Head Circumference --      Peak Flow --      Pain Score 06/15/22 1334 0     Pain Loc --      Pain Edu? --      Excl. in [place]? --    No data found.  Updated Vital Signs BP Marland Kitchen)  146/90 (BP Location: Left Arm)   Pulse 84   Temp 98.8 F (37.1 C) (Oral)   Resp 18   SpO2 98%   Visual Acuity Right Eye Distance:   Left Eye Distance:   Bilateral Distance:    Right Eye Near:   Left Eye Near:    Bilateral Near:     Physical Exam Vitals and nursing note reviewed.  Constitutional:      General: She is not in acute distress.    Appearance: She is normal weight. She is not toxic-appearing.  HENT:     Right Ear: Tympanic membrane, ear canal and external ear normal.     Left Ear: Tympanic membrane, ear canal and external ear normal.     Nose: Nose normal.  Eyes:     General: No scleral icterus.    Conjunctiva/sclera: Conjunctivae normal.  Pulmonary:     Effort: Pulmonary effort is normal.  Musculoskeletal:        General: Normal range of motion.     Cervical back: Neck supple.  Lymphadenopathy:     Cervical: No cervical adenopathy.  Skin:    General: Skin is warm and dry.     Findings: No rash.  Neurological:     Mental Status: She is alert and oriented to person, place, and time.     Gait: Gait normal.   Psychiatric:        Mood and Affect: Mood normal.        Behavior: Behavior normal.        Thought Content: Thought content normal.        Judgment: Judgment normal.      UC Treatments / Results  Labs (all labs ordered are listed, but only abnormal results are displayed) Labs Reviewed - No data to display  EKG   Radiology No results found.  Procedures Procedures (including critical care time)  Medications Ordered in UC Medications - No data to display  Initial Impression / Assessment and Plan / UC Course  I have reviewed the triage vital signs and the nursing notes.  Bilateral intermittent otalgia Covid exposure  She will await if symptoms develop to test herself at home for covid. I told her if her results turn positive she can let her PCP or do a virtual visit.   Final Clinical Impressions(s) / UC Diagnoses   Final diagnoses:  Close exposure to COVID-19 virus     Discharge Instructions      Test your self if you develop symptoms      ED Prescriptions   None    PDMP not reviewed this encounter.   Shelby Mattocks, [place] 06/15/22 1508

## 2022-06-17 ENCOUNTER — Encounter: Payer: Self-pay | Admitting: Physician Assistant

## 2022-06-17 ENCOUNTER — Ambulatory Visit
Admission: EM | Admit: 2022-06-17 | Discharge: 2022-06-17 | Disposition: A | Discharge status: Home / Self Care | Payer: Medicare PPO | Attending: Physician Assistant | Admitting: Physician Assistant

## 2022-06-17 DIAGNOSIS — U071 COVID-19: Secondary | ICD-10-CM | POA: Diagnosis not present

## 2022-06-17 MED ORDER — NIRMATRELVIR/RITONAVIR (PAXLOVID)TABLET: 3.0000 | ORAL_TABLET | Freq: Two times a day (BID) | ORAL | 0 refills | Status: AC

## 2022-06-17 NOTE — ED Triage Notes (Signed)
Pt was here 2 days ago with Covid exposure, pt has since developed SX and took at home Covid test which was positive this morning. Pt would like treatment. Body aches, runny nose, cough. Would like anti-viral

## 2022-06-17 NOTE — ED Provider Notes (Signed)
[facility]    CSN: [id number] Arrival date & time: 06/17/22  1300      History   Chief Complaint Chief Complaint  Patient presents with   Covid Positive    HPI Renee Chen is a 81 y.o. female.   Patient presents today with a 1 day history of URI symptoms including cough, congestion, body aches, runny nose.  She reports that her husband has tested positive for COVID-19.  She initially was feeling well until symptoms began yesterday so she took an at-home COVID test that was positive this morning.  She denies any high fever, chest pain, shortness of breath, nausea, vomiting.  She has not been taking any over-the-counter medication for symptom management.  She has had all of her COVID-19 vaccines including boosters.  She has not had COVID in the past.  She denies any significant past medical history including history of malignancy, diabetes, immunosuppression, cardiovascular disease, chronic liver/kidney disease, pulmonary disease, smoking.  Denies any recent antibiotic or steroid use.  She is anxious to feel better as she is scheduled to see her daughter later this month at a beach trip.    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   Past Surgical History:  Procedure Laterality Date   BREAST BIOPSY Left    neg   BREAST EXCISIONAL BIOPSY Left    CATARACT EXTRACTION     COLONOSCOPY     COLONOSCOPY WITH PROPOFOL N/A 08/04/2019   Procedure: COLONOSCOPY WITH PROPOFOL;  Surgeon: Toledo, Benay Pike, MD;  Location: [facility];  Service: Gastroenterology;  Laterality: N/A;   TUBAL LIGATION      OB History   No obstetric history on file.      Home Medications    Prior to Admission medications   Medication Sig Start  Date End Date Taking? Authorizing Provider  atorvastatin (LIPITOR) 10 MG tablet Take 10 mg by mouth daily. 05/03/22  Yes [provider]  CHOLECALCIFEROL PO Take 1,000 Units by mouth.   Yes [provider]  MAGNESIUM-OXIDE PO Take 300 capsules by mouth.   Yes [provider]  Multiple Vitamins-Minerals (CENTRUM ULTRA WOMENS PO) Take by mouth.   Yes [provider]  nirmatrelvir/ritonavir EUA (PAXLOVID) 20 x 150 MG & 10 x 100MG TABS Take 3 tablets by mouth 2 (two) times daily for 5 days. Patient GFR is 88. Take nirmatrelvir (150 mg) two tablets twice daily for 5 days and ritonavir (100 mg) one tablet twice daily for 5 days. 06/17/22 06/22/22 Yes Cintia Gleed, Derry Skill, PA-C  Probiotic Product (4X PROBIOTIC PO) Take by mouth.   Yes [provider]    Family History Family History  Problem Relation Age of Onset   Breast cancer Mother 57    Social History Social History   Tobacco Use   Smoking status: Never   Smokeless tobacco: Never  Vaping Use   Vaping Use: Never used  Substance Use Topics   Alcohol use: Not Currently   Drug use: Never     Allergies   Patient has no known allergies.   Review of Systems Review of Systems  Constitutional:  Positive for activity change and fatigue.  Negative for appetite change and fever.  HENT:  Positive for congestion and rhinorrhea. Negative for sinus pressure, sneezing and sore throat.   Respiratory:  Positive for cough. Negative for shortness of breath.   Cardiovascular:  Negative for chest pain.  Gastrointestinal:  Negative for abdominal pain, diarrhea, nausea and vomiting.  Neurological:  Negative for dizziness, light-headedness and headaches.     Physical Exam Triage Vital Signs ED Triage Vitals  Enc Vitals Group     BP 06/17/22 1359 (!) 143/75     Pulse Rate 06/17/22 1359 88     Resp 06/17/22 1359 18     Temp 06/17/22 1359 100 F (37.8 C)     Temp Source 06/17/22 1359 Oral     SpO2 06/17/22 1359  98 %     Weight 06/17/22 1359 128 lb (58.1 kg)     Height 06/17/22 1359 _0  (1.626 m)     Head Circumference --      Peak Flow --      Pain Score 06/17/22 1358 0     Pain Loc --      Pain Edu? --      Excl. in [place]? --    No data found.  Updated Vital Signs BP (!) 143/75 (BP Location: Left Arm)   Pulse 88   Temp 100 F (37.8 C) (Oral)   Resp 18   Ht _1  (1.626 m)   Wt 128 lb (58.1 kg)   SpO2 98%   BMI 21.97 kg/m   Visual Acuity Right Eye Distance:   Left Eye Distance:   Bilateral Distance:    Right Eye Near:   Left Eye Near:    Bilateral Near:     Physical Exam Vitals reviewed.  Constitutional:      General: She is awake. She is not in acute distress.    Appearance: Normal appearance. She is well-developed. She is not ill-appearing.     Comments: Very pleasant female appears stated age in no acute distress sitting comfortably in exam room  HENT:     Head: Normocephalic and atraumatic.     Right Ear: External ear normal.     Left Ear: External ear normal.  Cardiovascular:     Rate and Rhythm: Normal rate and regular rhythm.     Heart sounds: Normal heart sounds, S1 normal and S2 normal. No murmur heard. Pulmonary:     Effort: Pulmonary effort is normal.     Breath sounds: Normal breath sounds. No wheezing, rhonchi or rales.     Comments: Clear to auscultation bilaterally Psychiatric:        Behavior: Behavior is cooperative.      UC Treatments / Results  Labs (all labs ordered are listed, but only abnormal results are displayed) Labs Reviewed - No data to display  EKG   Radiology No results found.  Procedures Procedures (including critical care time)  Medications Ordered in UC Medications - No data to display  Initial Impression / Assessment and Plan / UC Course  I have reviewed the triage vital signs and the nursing notes.  Pertinent labs & imaging results that were available during my care of the patient were reviewed by me and considered  in my medical decision making (see chart for details).     Patient is well-appearing, afebrile, nontoxic, nontachycardic.  Given she had an at-home COVID test that was positive and her symptoms have started within the last 5 days she is a candidate for antiviral  therapy based on her age.  She denies any additional risk factors.  Patient had metabolic panel obtained 11/18/8344 that is available in [place] with creatinine of 0.68 and EGFR of 88.  Her only medication that would interfere with Paxlovid is atorvastatin.  Discussed that she is to hold this medication she is on the Paxlovid and for an additional 3 days.  Can use over-the-counter medication including Tylenol ibuprofen for additional symptom relief.  She is to rest and drink plenty of fluid.  Discussed current isolation guidelines from the CDC that she is to stay isolated from others for 5 days from symptom onset and wear an N25 mask until 10 days have passed since her symptoms began.  Discussed that she should monitor her oxygen saturation at home and if this is below 90% she needs to go to the ER and if below 93% she should be reevaluated.  Strict return precautions given.  Work excuse note provided.  Final Clinical Impressions(s) / UC Diagnoses   Final diagnoses:  ITVIF-12     Discharge Instructions      We are starting Paxlovid.  Take this twice daily for 5 days.  While you are on this medication you need to stop your atorvastatin.  You should not restart it for 3 days after completing the course of medication.  You can use over-the-counter medications including Tylenol and ibuprofen.  Obtain a pulse oximeter from the pharmacy.  Monitor your oxygen level.  If this is below 90% you need to go to the emergency room and if below 93% you should be reevaluated.  If anything worsens including shortness of breath, fever, nausea, vomiting, weakness, worsening cough you need to go to the emergency room immediately.     ED Prescriptions      Medication Sig Dispense Auth. Provider   nirmatrelvir/ritonavir EUA (PAXLOVID) 20 x 150 MG & 10 x 100MG TABS Take 3 tablets by mouth 2 (two) times daily for 5 days. Patient GFR is 88. Take nirmatrelvir (150 mg) two tablets twice daily for 5 days and ritonavir (100 mg) one tablet twice daily for 5 days. 30 tablet Ryelee Albee, Derry Skill, PA-C      PDMP not reviewed this encounter.   Renee Croak, PA-C 06/17/22 1425

## 2022-06-17 NOTE — Discharge Instructions (Signed)
We are starting Paxlovid.  Take this twice daily for 5 days.  While you are on this medication you need to stop your atorvastatin.  You should not restart it for 3 days after completing the course of medication.  You can use over-the-counter medications including Tylenol and ibuprofen.  Obtain a pulse oximeter from the pharmacy.  Monitor your oxygen level.  If this is below 90% you need to go to the emergency room and if below 93% you should be reevaluated.  If anything worsens including shortness of breath, fever, nausea, vomiting, weakness, worsening cough you need to go to the emergency room immediately.

## 2022-07-02 ENCOUNTER — Ambulatory Visit
Admission: RE | Admit: 2022-07-02 | Discharge: 2022-07-02 | Disposition: A | Discharge status: Home / Self Care | Payer: Medicare PPO | Source: Ambulatory Visit | Attending: Family | Admitting: Family

## 2022-07-02 DIAGNOSIS — Z780 Asymptomatic menopausal state: Secondary | ICD-10-CM | POA: Insufficient documentation

## 2023-03-18 ENCOUNTER — Other Ambulatory Visit: Payer: Self-pay

## 2023-03-18 DIAGNOSIS — Z1231 Encounter for screening mammogram for malignant neoplasm of breast: Secondary | ICD-10-CM

## 2023-04-24 ENCOUNTER — Ambulatory Visit
Admission: RE | Admit: 2023-04-24 | Discharge: 2023-04-24 | Disposition: A | Discharge status: Home / Self Care | Payer: Medicare PPO | Source: Ambulatory Visit | Attending: Family | Admitting: Family

## 2023-04-24 DIAGNOSIS — Z1231 Encounter for screening mammogram for malignant neoplasm of breast: Secondary | ICD-10-CM | POA: Diagnosis present

## 2023-04-24 LAB — HM MAMMOGRAPHY

## 2023-08-28 ENCOUNTER — Ambulatory Visit: Payer: Medicare PPO | Admitting: Family

## 2023-10-14 ENCOUNTER — Encounter: Payer: Self-pay | Admitting: *Deleted

## 2023-10-17 ENCOUNTER — Encounter: Payer: Self-pay | Admitting: Student

## 2023-10-17 ENCOUNTER — Ambulatory Visit: Payer: Medicare PPO | Admitting: Student

## 2023-10-17 VITALS — BP 136/86 | HR 84 | Temp 97.2°F | Ht 64.0 in | Wt 127.0 lb

## 2023-10-17 DIAGNOSIS — Q846 Other congenital malformations of nails: Secondary | ICD-10-CM | POA: Diagnosis not present

## 2023-10-17 DIAGNOSIS — R030 Elevated blood-pressure reading, without diagnosis of hypertension: Secondary | ICD-10-CM | POA: Diagnosis not present

## 2023-10-17 DIAGNOSIS — E559 Vitamin D deficiency, unspecified: Secondary | ICD-10-CM

## 2023-10-17 DIAGNOSIS — R3915 Urgency of urination: Secondary | ICD-10-CM | POA: Insufficient documentation

## 2023-10-17 DIAGNOSIS — E538 Deficiency of other specified B group vitamins: Secondary | ICD-10-CM

## 2023-10-17 NOTE — Progress Notes (Signed)
 [facility] clinic [facility].   Provider: Dr. Richerd Brigham  Code Status: Full Code Goals of Care:     10/17/2023    2:11 PM  Advanced Directives  Does Patient Have a Medical Advance Directive? Yes  Type of Estate Agent of [place];Living will  Does patient want to make changes to medical advance directive? No - Patient declined  Copy of Healthcare Power of Attorney in Chart? No - copy requested     Chief Complaint  Patient presents with   Establish Care    Establish Care.     HPI: Patient is a 83 y.o. female seen today to Establish Care.   Discussed the use of AI scribe software for clinical note transcription with the patient, who gave verbal consent to proceed.  History of Present Illness   She presented with a few minor concerns. The first issue was a sharp pain in her elbow that began suddenly in October. The pain was severe at night, especially when pressure was applied to the elbow while lying on the mattress. The patient denied any trauma or strenuous activity that could have caused the pain. Over time, the pain has lessened but has not completely resolved.  The patient also reported issues with her nails, which frequently split, crack, and peel off. She wondered if she might be deficient in certain vitamins or minerals. She reported taking a daily vitamin D  supplement and was open to trying other supplements if necessary.  The patient also mentioned occasional constipation, which she managed with a fiber supplement. She was unsure if it was safe to take a stool softener regularly and sought clarification on this matter.  Past Medical History: The patient also reported a history of high cholesterol, for which she was taking a low-dose statin. She had initially been resistant to taking statins but had seen a significant improvement in her cholesterol levels since starting the medication. She also mentioned a history of skin cancer, with lesions removed from  her right cheek and left lower leg. She has regular skin checks to monitor for any new lesions. The patient also reported a history of uterine prolapse, which she manages with a pessary. She has regular check-ups to monitor this condition. 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  Mobility: The patient is physically active, aiming for 10,000 to 12,000 steps per day. She reported occasional leg cramps, which she manages by placing a bar of soap under her sheets at night. She also reported a burning sensation in her legs at night, which she manages by placing a pillow at the bottom of her bed. She wondered if these symptoms could be side effects of her statin medication.   Mentation: 30/30 on [place] [employer]    Matters Most: having a geriatric provider.    Social History: The patient, a former tourist information centre manager, recently relocated to [place] apartments on [address]. She is from [place]. She has been married for 58 years. She has two children in [place] and [place], two granddaughters as well. She Reports husband has trouble comprehending and gets confused, also has hearing issues. Recommend cognitive screening for husband. Consider hearing evaluation if not already done.       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   Osteoporosis    Presence of pessary    Pure hypercholesterolemia    Urinary incontinence     Past Surgical History:  Procedure Laterality Date   BREAST BIOPSY Left    neg   BREAST EXCISIONAL BIOPSY Left    CATARACT EXTRACTION     COLONOSCOPY     COLONOSCOPY WITH PROPOFOL  N/A 08/04/2019   Procedure: COLONOSCOPY WITH PROPOFOL ;  Surgeon: Toledo, Ladell POUR, MD;  Location: [facility] END[facility]COPY;  Service: Gastroenterology;  Laterality: N/A;   TUBAL LIGATION      No  Known Allergies  Outpatient Encounter Medications as of 10/17/2023  Medication Sig   atorvastatin  (LIPITOR) 10 MG tablet Take 10 mg by mouth daily.   Biotin w/ Vitamins C & E (HAIR SKIN & NAILS GUMMIES PO) Take 2 each by mouth daily.   Cholecalciferol (VITAMIN D3) 25 MCG (1000 UT) CAPS Take 2,000 Units by mouth daily.   Coenzyme Q10 100 MG capsule Take 300 mg by mouth daily.   Multiple Vitamins-Minerals (CENTRUM ULTRA WOMENS PO) Take by mouth.   Wheat Dextrin (EQ FIBER POWDER PO) Use daily   No facility-administered encounter medications on file as of 10/17/2023.    Review of Systems:  Review of Systems  Health Maintenance  Topic Date Due   Medicare Annual Wellness (AWV)  02/18/2017   COVID-19 Vaccine (10 - 2024-25 season) 11/04/2023   DTaP/Tdap/Td (2 - Td or Tdap) 02/25/2028   Pneumonia Vaccine 35+ Years old  Completed   INFLUENZA VACCINE  Completed   DEXA SCAN  Completed   Zoster Vaccines- Shingrix  Completed   HPV VACCINES  Aged Out    Physical Exam: Vitals:   10/17/23 1407  BP: 136/86  Pulse: 84  Temp: (!) 97.2 F (36.2 C)  SpO2: 100%  Weight: 127 lb (57.6 kg)  Height: 5' 4 (1.626 m)   Body mass index is 21.8 kg/m. Physical Exam Physical Exam   VITALS: BP- 136/86 HEENT: External auditory canals clear, no wax present. Pupils reactive and equal. Nails chipping and mid line splitting NECK: No lymphadenopathy palpated. CHEST: Lungs clear to auscultation. CARDIOVASCULAR: Heart sounds normal, no murmurs. EXTREMITIES: No edema, no calluses, toenails well-trimmed. SKIN: Well moisturized.     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 Pain has improved but not completely resolved. No signs of gout or infection. -Continue to monitor and report if symptoms worsen or do not continue to improve.  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 No apparent deficiencies in past labs. -Check vitamin B12, vitamin D , and thyroid levels. -Consider collagen powder supplement if tolerated.  Constipation Irregular bowel movements with occasional use of a fiber supplement. -Consider regular use of MiraLAX 3 times a week as needed.  Hyperlipidemia On low-dose lovastatin with good response. Reports occasional leg cramps. -Continue lovastatin. Monitor for worsening muscle symptoms that may be related to statin use.  General Health Maintenance -Continue Preservision for eye health. -Continue vitamin D  supplement. -Continue regular skin checks for history of skin cancer. -Check vitamin B12 levels. -Continue regular physical activity (walking, swimming, exercise classes).      I spent greater than 60 minutes for the care of this patient in face to face time, chart review, clinical documentation, patient education.   Richerd MYRTIS Brigham, MD, [facility] [phone]

## 2023-10-17 NOTE — Patient Instructions (Addendum)
 Please come to the clinic at 7:30AM Monday Morning to have your labs collected.  Please consider purchasing Preservision (Areds 2) twice daily vitamin for the eyes.   Please schedule an appointment with dermatology for your next skin exam. Another option would be [facility].

## 2023-10-21 LAB — COMPLETE METABOLIC PANEL WITH GFR
AG Ratio: 1.8 (calc) (ref 1.0–2.5)
ALT: 12 U/L (ref 6–29)
AST: 12 U/L (ref 10–35)
Albumin: 4.5 g/dL (ref 3.6–5.1)
Alkaline phosphatase (APISO): 67 U/L (ref 37–153)
BUN: 13 mg/dL (ref 7–25)
CO2: 29 mmol/L (ref 20–32)
Calcium: 9.8 mg/dL (ref 8.6–10.4)
Chloride: 105 mmol/L (ref 98–110)
Creat: 0.81 mg/dL (ref 0.60–0.95)
Globulin: 2.5 g/dL (ref 1.9–3.7)
Glucose, Bld: 93 mg/dL (ref 65–99)
Potassium: 4.8 mmol/L (ref 3.5–5.3)
Sodium: 140 mmol/L (ref 135–146)
Total Bilirubin: 0.7 mg/dL (ref 0.2–1.2)
Total Protein: 7 g/dL (ref 6.1–8.1)
eGFR: 73 mL/min/{1.73_m2} (ref >=60)

## 2023-10-21 LAB — CBC WITH DIFFERENTIAL/PLATELET
Absolute Lymphocytes: 1441 {cells}/uL (ref 850–3900)
Absolute Monocytes: 504 {cells}/uL (ref 200–950)
Basophils Absolute: 57 {cells}/uL (ref 0–200)
Basophils Relative: 0.8 %
Eosinophils Absolute: 199 {cells}/uL (ref 15–500)
Eosinophils Relative: 2.8 %
HCT: 43.5 % (ref 35.0–45.0)
Hemoglobin: 14.2 g/dL (ref 11.7–15.5)
MCH: 30.4 pg (ref 27.0–33.0)
MCHC: 32.6 g/dL (ref 32.0–36.0)
MCV: 93.1 fL (ref 80.0–100.0)
MPV: 11.3 fL (ref 7.5–12.5)
Monocytes Relative: 7.1 %
Neutro Abs: 4899 {cells}/uL (ref 1500–7800)
Neutrophils Relative %: 69 %
Platelets: 223 10*3/uL (ref 140–400)
RBC: 4.67 10*6/uL (ref 3.80–5.10)
RDW: 12.4 % (ref 11.0–15.0)
Total Lymphocyte: 20.3 %
WBC: 7.1 10*3/uL (ref 3.8–10.8)

## 2023-10-21 LAB — VITAMIN D 25 HYDROXY (VIT D DEFICIENCY, FRACTURES): Vit D, 25-Hydroxy: 53 ng/mL (ref 30–100)

## 2023-10-21 LAB — TSH+FREE T4: TSH W/REFLEX TO FT4: 1.38 m[IU]/L (ref 0.40–4.50)

## 2023-10-21 LAB — VITAMIN B12: Vitamin B-12: 731 pg/mL (ref 200–1100)

## 2023-11-04 ENCOUNTER — Other Ambulatory Visit: Payer: Self-pay

## 2023-11-04 MED ORDER — ATORVASTATIN CALCIUM 10 MG PO TABS: 10.0000 mg | ORAL_TABLET | Freq: Every day | ORAL | 1 refills | Status: DC

## 2023-11-04 NOTE — Telephone Encounter (Signed)
Patient states that she needs refill on medication because she just moved to [facility]. Medication pend and sent to PCP Earnestine Mealing, MD for approval.

## 2024-02-05 ENCOUNTER — Other Ambulatory Visit: Payer: Self-pay | Admitting: Student

## 2024-03-05 ENCOUNTER — Encounter: Payer: Self-pay | Admitting: Student

## 2024-03-05 ENCOUNTER — Ambulatory Visit
Admission: RE | Admit: 2024-03-05 | Discharge: 2024-03-05 | Disposition: A | Discharge status: Home / Self Care | Attending: Student | Admitting: Student

## 2024-03-05 ENCOUNTER — Ambulatory Visit: Admitting: Student

## 2024-03-05 ENCOUNTER — Ambulatory Visit
Admission: RE | Admit: 2024-03-05 | Discharge: 2024-03-05 | Disposition: A | Discharge status: Home / Self Care | Source: Ambulatory Visit | Attending: Student | Admitting: Student

## 2024-03-05 VITALS — BP 134/78 | HR 91 | Temp 97.6°F | Ht 64.0 in | Wt 128.2 lb

## 2024-03-05 DIAGNOSIS — M25551 Pain in right hip: Secondary | ICD-10-CM | POA: Diagnosis present

## 2024-03-05 DIAGNOSIS — G8929 Other chronic pain: Secondary | ICD-10-CM | POA: Diagnosis present

## 2024-03-05 DIAGNOSIS — M25511 Pain in right shoulder: Secondary | ICD-10-CM

## 2024-03-05 NOTE — Progress Notes (Signed)
 [facility]  Provider: Jann Melody  Code Status: Full Code Goals of Care:     03/05/2024    9:20 AM  Advanced Directives  Does Patient Have a Medical Advance Directive? Yes  Type of Estate agent of [place];Out of facility DNR (pink MOST or yellow form);Living will  Does patient want to make changes to medical advance directive? No - Patient declined  Copy of Healthcare Power of Attorney in Chart? No - copy requested     Chief Complaint  Patient presents with   Hip Pain   Shoulder Pain    Right Hip and Shoulder pain for about 4-5 weeks. No Trauma    HPI: Patient is a 83 y.o. female seen today for an acute visit for Discussed the use of AI scribe software for clinical note transcription with the patient, who gave verbal consent to proceed.  History of Present Illness   Renee Chen is an 83 year old female who presents with right hip and shoulder pain.  She has been experiencing right hip pain for the last month, initially presenting with significant discomfort in the hip area, radiating down the front and through the leg, particularly exacerbated by movement and walking. The hip pain has improved considerably, though it remains sore.  The shoulder pain is described as severe soreness on the bone, with pain radiating down the muscle. She experiences significant discomfort with certain movements, such as fastening a seatbelt and reaching, which require her to adjust her movements to avoid pain. She avoids using her right hand for reaching and cannot perform simple tasks like wiping surfaces with that hand. No numbness or tingling in the hand, but she reports occasional popping sounds in the shoulder.  She mentions a recent onset of neck tightness. She engages in walking for exercise and has participated in cardio fit dance and fab classes, though she has been cautious with lifting activities due to her shoulder  pain.  Her family history includes rheumatoid arthritis in her father.        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   Osteoporosis    Presence of pessary    Pure hypercholesterolemia    Urinary incontinence     Past Surgical History:  Procedure Laterality Date   BREAST BIOPSY Left    neg   BREAST EXCISIONAL BIOPSY Left    CATARACT EXTRACTION     COLONOSCOPY     COLONOSCOPY WITH PROPOFOL  N/A 08/04/2019   Procedure: COLONOSCOPY WITH PROPOFOL ;  Surgeon: Toledo, Alphonsus Jeans, MD;  Location: [facility];  Service: Gastroenterology;  Laterality: N/A;   TUBAL LIGATION      No Known Allergies  Outpatient Encounter Medications as of 03/05/2024  Medication Sig   atorvastatin  (LIPITOR) 10 MG tablet TAKE ONE TABLET BY MOUTH ONCE DAILY   Biotin w/ Vitamins C & E (HAIR SKIN & NAILS GUMMIES PO) Take 2 each by mouth daily.   Cholecalciferol (VITAMIN D3) 25 MCG (1000 UT) CAPS Take 2,000 Units by mouth daily.   Coenzyme Q10 100 MG capsule Take 300 mg by mouth daily.   Collagen-Vitamin C-Biotin (COLLAGEN PO) Take 1 tablet by mouth daily.   Multiple Vitamins-Minerals (CENTRUM ULTRA WOMENS PO) Take by mouth.   Wheat Dextrin (EQ FIBER POWDER PO) Use daily  No facility-administered encounter medications on file as of 03/05/2024.    Review of Systems:  Review of Systems  Health Maintenance  Topic Date Due   Medicare Annual Wellness (AWV)  02/18/2017   COVID-19 Vaccine (10 - 2024-25 season) 11/04/2023   INFLUENZA VACCINE  05/14/2024   DTaP/Tdap/Td (2 - Td or Tdap) 02/25/2028   Pneumonia Vaccine 51+ Years old  Completed   DEXA SCAN  Completed   Zoster Vaccines- Shingrix  Completed   HPV VACCINES  Aged Out   Meningococcal B Vaccine  Aged Out    Physical Exam: Vitals:   03/05/24 0915  BP: 134/78  Pulse: 91  Temp: 97.6 F (36.4 C)  SpO2: 99%  Weight: 128 lb 3.2 oz (58.2  kg)  Height: 5\' 4"  (1.626 m)   Body mass index is 22.01 kg/m. Physical Exam Physical Exam   MUSCULOSKELETAL: Right shoulder pain with resisted abduction and external rotation. Spine curvature to the right. No hip tenderness on palpation. Neck tightness.     Bilateral hips without TTP or limitations in ROM 5/5 strength bilaterally  Labs reviewed: Basic Metabolic Panel: Recent Labs    10/20/23 0740  NA 140  K 4.8  CL 105  CO2 29  GLUCOSE 93  BUN 13  CREATININE 0.81  CALCIUM  9.8   Liver Function Tests: Recent Labs    10/20/23 0740  AST 12  ALT 12  BILITOT 0.7  PROT 7.0   No results for input(s): "LIPASE", "AMYLASE" in the last 8760 hours. No results for input(s): "AMMONIA" in the last 8760 hours. CBC: Recent Labs    10/20/23 0740  WBC 7.1  NEUTROABS 4,899  HGB 14.2  HCT 43.5  MCV 93.1  PLT 223   Lipid Panel: No results for input(s): "CHOL", "HDL", "LDLCALC", "TRIG", "CHOLHDL", "LDLDIRECT" in the last 8760 hours. No results found for: "HGBA1C"  Procedures since last visit: No results found. Results           Assessment/Plan     Right shoulder pain with possible rotator cuff issues Chronic right shoulder pain with possible rotator cuff involvement, exacerbated by movements such as fastening a seatbelt and reaching. No numbness or tingling. Crepitus likely due to arthritis. Differential includes rotator cuff pathology and cervical spine issues, but examination suggests shoulder involvement rather than cervical spine. Possible athletic injury from pickleball. - Order right shoulder x-ray - Refer to physical therapy for shoulder strengthening exercises - Educate on potential delay in imaging results, up to two weeks - Discuss the role of physical therapy in providing education and exercises for independence  Arthritis Suspected arthritis in right shoulder and hip, contributing to pain and crepitus. Family history of rheumatoid arthritis, but current  symptoms suggest osteoarthritis. No severe arthritis suspected in hip due to improvement. - Order x-rays to assess for arthritis severity - Consider physical therapy for strengthening and pain management  Right hip pain Right hip pain with significant improvement over time. Initially severe, now considerably better. Examination reveals no tenderness. Possible arthritis, but improvement suggests other etiology. No severe arthritis suspected. - Order right hip x-ray - Consider physical therapy for pain management and muscle strengthening if needed       Labs/tests ordered:  * No order type specified * Next appt:  04/23/2024

## 2024-03-05 NOTE — Patient Instructions (Signed)
 VISIT SUMMARY:  Today, you were seen for right hip and shoulder pain. Your right hip pain has improved significantly over the past month, but your right shoulder pain remains severe and affects your daily activities. We discussed your symptoms, possible causes, and the next steps for diagnosis and treatment.  YOUR PLAN:  -RIGHT SHOULDER PAIN WITH POSSIBLE ROTATOR CUFF ISSUES: Your right shoulder pain may be due to issues with the rotator cuff, which is a group of muscles and tendons that stabilize the shoulder. We will get an x-ray of your right shoulder and refer you to physical therapy to help strengthen your shoulder and manage the pain. Please note that the imaging results may take up to two weeks. Physical therapy will provide you with exercises and education to help you become more independent in managing your shoulder pain.  -ARTHRITIS: Arthritis is a condition that causes inflammation and pain in the joints. We suspect you may have arthritis in your right shoulder and hip. We will order x-rays to assess the severity of the arthritis and may recommend physical therapy to help with strengthening and pain management.  -RIGHT HIP PAIN: Your right hip pain has improved significantly, which suggests it may not be due to severe arthritis. We will get an x-ray of your right hip to further investigate the cause of the pain. If needed, physical therapy may be recommended to help manage the pain and strengthen the muscles around your hip.  INSTRUCTIONS:  Please follow up with the imaging studies for your right shoulder and hip as ordered. Attend physical therapy sessions as recommended to help manage your shoulder and hip pain. If you have any new symptoms or if your pain worsens, please contact our office.

## 2024-03-10 ENCOUNTER — Ambulatory Visit: Payer: Self-pay | Admitting: Student

## 2024-03-12 ENCOUNTER — Telehealth: Payer: Self-pay

## 2024-03-12 NOTE — Telephone Encounter (Signed)
Patient Notified of Results

## 2024-03-12 NOTE — Telephone Encounter (Signed)
 Copied from CRM [id number]. Topic: Clinical - Lab/Test Results >> Mar 12, 2024 12:14 PM Lenon Radar A wrote: Reason for CRM: Patient called in returning call from clinical staff. Please contact patient if needed regarding test results. Patient can be reached at [phone].

## 2024-03-12 NOTE — Telephone Encounter (Signed)
 Patient called back about imaging results.

## 2024-03-17 ENCOUNTER — Telehealth: Payer: Self-pay

## 2024-03-17 NOTE — Telephone Encounter (Signed)
 Spoke with patient and she stated the physical therapy is working and the shoulder still has some soreness but the physical therapy is working. Patient was pleased with the results and want to thank Valrie Gehrig, MD for taking care of her.   Message sent to Valrie Gehrig, MD

## 2024-03-17 NOTE — Telephone Encounter (Signed)
 Spoke with patient this afternoon in regards of her hip x-ray results  that was done on 03/05/24 and she has not heard from Valrie Gehrig, MD and want to give a call about the results. I did explain to the patient that we are waiting from the results from Valrie Gehrig, MD and we will give her a call back.  Please advise   Message sent to Valrie Gehrig, MD

## 2024-03-17 NOTE — Telephone Encounter (Signed)
 Thank you for the message. Please contact patient to discuss the following:   Her hip x-ray came back and showed: No fracture or dislocation is seen. Very mild degenerative changes of both hips.  At this stage she can have physical therapy focus on hip pain in addition to the shoulder, or go to sports medicine and see if they can do a steroid injection.   I'm fine with either option.   Let me know.

## 2024-03-26 ENCOUNTER — Other Ambulatory Visit: Payer: Self-pay | Admitting: Student

## 2024-03-26 DIAGNOSIS — Z1231 Encounter for screening mammogram for malignant neoplasm of breast: Secondary | ICD-10-CM

## 2024-03-29 ENCOUNTER — Encounter: Payer: Self-pay | Admitting: Student

## 2024-04-23 ENCOUNTER — Encounter: Payer: Self-pay | Admitting: Student

## 2024-04-23 ENCOUNTER — Ambulatory Visit: Admitting: Student

## 2024-04-23 VITALS — BP 132/74 | HR 72 | Temp 98.3°F | Ht 64.0 in | Wt 129.0 lb

## 2024-04-23 DIAGNOSIS — G8929 Other chronic pain: Secondary | ICD-10-CM

## 2024-04-23 DIAGNOSIS — M25511 Pain in right shoulder: Secondary | ICD-10-CM | POA: Diagnosis not present

## 2024-04-23 DIAGNOSIS — E559 Vitamin D deficiency, unspecified: Secondary | ICD-10-CM

## 2024-04-23 DIAGNOSIS — E538 Deficiency of other specified B group vitamins: Secondary | ICD-10-CM | POA: Diagnosis not present

## 2024-04-23 DIAGNOSIS — R030 Elevated blood-pressure reading, without diagnosis of hypertension: Secondary | ICD-10-CM

## 2024-04-23 DIAGNOSIS — M25551 Pain in right hip: Secondary | ICD-10-CM

## 2024-04-23 NOTE — Patient Instructions (Signed)
 VISIT SUMMARY:  During your visit, we discussed your shoulder pain and arthritis. You have experienced significant improvement in your shoulder pain with physical therapy, although you still have some achiness and tightness. Your hip arthritis causes occasional mild discomfort, but it is manageable with your current treatment plan.  YOUR PLAN:  -SHOULDER PAIN: Shoulder pain can be due to various reasons, including muscle tears or strain. You have shown improvement with physical therapy, but still experience some achiness and tightness. Continue with your physical therapy exercises, discuss with your therapist about possibly reducing the frequency of sessions, and consider purchasing a massage gun for home use. Use heat therapy and hot showers to relieve tightness, and contact your physical therapist if your symptoms worsen.  -ARTHRITIS: Arthritis is a condition that causes inflammation and pain in the joints. Your hip arthritis causes occasional mild discomfort, especially during certain movements. Continue with your current management plan and monitor your symptoms. Contact your healthcare provider if the pain worsens.  INSTRUCTIONS:  Continue with your physical therapy exercises and discuss with your therapist about possibly reducing the frequency of sessions. Consider purchasing a massage gun for home use. Use heat therapy and hot showers to relieve tightness. Monitor your arthritis symptoms and contact your healthcare provider if the pain worsens.

## 2024-04-23 NOTE — Progress Notes (Signed)
 [facility] Provider: ABDUL  Code Status: full code Goals of Care:     03/05/2024    9:20 AM  Advanced Directives  Does Patient Have a Medical Advance Directive? Yes  Type of Estate agent of [place];Out of facility DNR (pink MOST or yellow form);Living will  Does patient want to make changes to medical advance directive? No - Patient declined  Copy of Healthcare Power of Attorney in Chart? No - copy requested     Chief Complaint  Patient presents with   Medical Management of Chronic Issues    Medical Management of Chronic Issues. 6 Week Follow up    HPI: Patient is a 83 y.o. female seen today for medical management of chronic diseases.   Discussed the use of AI scribe software for clinical note transcription with the patient, who gave verbal consent to proceed.  History of Present Illness   Renee Chen is an 83 year old female with arthritis who presents with shoulder pain and decreased range of motion.  She has experienced significant improvement in her shoulder pain, which was previously sharp and limited her ability to reach for objects. Currently, the pain is described as achy and sore in specific areas, with no sharp pains. Her range of motion is not fully restored according to her physical therapist, but she feels she is progressing well overall. She notes tightness in the shoulder area.  Her hip, affected by arthritis, occasionally causes discomfort during certain movements, particularly when walking. However, it does not cause significant pain, and she describes it as a mild awareness of her hip.  She recalls a previous episode of right shoulder pain several years ago, which was resolved with exercises from the [employer] method at the [facility]. She maintained these exercises for several years but has since forgotten them, leading to a recurrence of pain.  She finds relief from tightness in her  neck and shoulder area with heat, hot showers, and massage. She has also benefited from a massage gun used by her physical therapist.         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   Osteoporosis    Presence of pessary    Pure hypercholesterolemia    Urinary incontinence     Past Surgical History:  Procedure Laterality Date   BREAST BIOPSY Left    neg   BREAST EXCISIONAL BIOPSY Left    CATARACT EXTRACTION     COLONOSCOPY     COLONOSCOPY WITH PROPOFOL  N/A 08/04/2019   Procedure: COLONOSCOPY WITH PROPOFOL ;  Surgeon: Toledo, Ladell POUR, MD;  Location: [facility] ENDOSCOPY;  Service: Gastroenterology;  Laterality: N/A;   TUBAL LIGATION      No Known Allergies  Outpatient Encounter Medications as of 04/23/2024  Medication Sig   atorvastatin  (LIPITOR) 10 MG tablet TAKE ONE TABLET BY MOUTH ONCE DAILY   Biotin w/ Vitamins C & E (HAIR SKIN & NAILS GUMMIES PO) Take 2 each by mouth daily.   Cholecalciferol (VITAMIN D3) 25 MCG (1000 UT) CAPS Take 2,000 Units by mouth daily.   Coenzyme Q10 100 MG capsule Take 300 mg by mouth daily.   Collagen-Vitamin C-Biotin (COLLAGEN PO) Take 1 tablet by mouth daily.   Multiple Vitamins-Minerals (CENTRUM ULTRA WOMENS PO) Take by mouth.   Wheat Dextrin (  EQ FIBER POWDER PO) Use daily   No facility-administered encounter medications on file as of 04/23/2024.    Review of Systems:  Review of Systems  Health Maintenance  Topic Date Due   Medicare Annual Wellness (AWV)  02/18/2017   COVID-19 Vaccine (10 - 2024-25 season) 11/04/2023   INFLUENZA VACCINE  05/14/2024   DTaP/Tdap/Td (2 - Td or Tdap) 02/25/2028   Pneumococcal Vaccine: 50+ Years  Completed   DEXA SCAN  Completed   Zoster Vaccines- Shingrix  Completed   Hepatitis B Vaccines  Aged Out   HPV VACCINES  Aged Out   Meningococcal B Vaccine  Aged Out    Physical Exam: Vitals:   04/23/24  0905  BP: 132/74  Pulse: 72  Temp: 98.3 F (36.8 C)  SpO2: 99%  Weight: 129 lb (58.5 kg)  Height: 5' 4 (1.626 m)   Body mass index is 22.14 kg/m. Physical Exam Constitutional:      Appearance: Normal appearance.  Cardiovascular:     Rate and Rhythm: Normal rate and regular rhythm.     Pulses: Normal pulses.     Heart sounds: Normal heart sounds.  Pulmonary:     Effort: Pulmonary effort is normal.  Abdominal:     General: Abdomen is flat. Bowel sounds are normal.     Palpations: Abdomen is soft.  Musculoskeletal:        General: No swelling or tenderness.  Skin:    General: Skin is warm and dry.     Capillary Refill: Capillary refill takes 2 to 3 seconds.  Neurological:     Mental Status: She is alert and oriented to person, place, and time.     Gait: Gait normal.  Psychiatric:        Mood and Affect: Mood normal.    Physical Exam          Labs reviewed: Basic Metabolic Panel: Recent Labs    10/20/23 0740  NA 140  K 4.8  CL 105  CO2 29  GLUCOSE 93  BUN 13  CREATININE 0.81  CALCIUM  9.8   Liver Function Tests: Recent Labs    10/20/23 0740  AST 12  ALT 12  BILITOT 0.7  PROT 7.0   No results for input(s): LIPASE, AMYLASE in the last 8760 hours. No results for input(s): AMMONIA in the last 8760 hours. CBC: Recent Labs    10/20/23 0740  WBC 7.1  NEUTROABS 4,899  HGB 14.2  HCT 43.5  MCV 93.1  PLT 223   Lipid Panel: No results for input(s): CHOL, HDL, LDLCALC, TRIG, CHOLHDL, LDLDIRECT in the last 8760 hours. No results found for: HGBA1C  Procedures since last visit: No results found. Results   LABS LFTs: normal (10/25/2023)  RADIOLOGY X-ray: evidence of arthritis      Assessment/Plan    Shoulder pain Improvement with physical therapy, residual achiness and soreness, no sharp pains, incomplete range of motion, possible age-related muscle tear, tightness relieved by exercises and massage. 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 - Continue current management for arthritis - Monitor symptoms and contact healthcare provider if pain worsens       Labs/tests ordered:  - VITAMIN D  25 Hydroxy (Vit-D  Deficiency, Fractures) - Vitamin B12 - Lipid panel - TSH - Hemoglobin A1c - CBC With Differential/Platelet - COMPLETE METABOLIC PANEL WITHOUT GFR Next appt:  04/30/2024

## 2024-04-26 ENCOUNTER — Ambulatory Visit: Payer: Self-pay | Admitting: Student

## 2024-04-27 ENCOUNTER — Ambulatory Visit
Admission: RE | Admit: 2024-04-27 | Discharge: 2024-04-27 | Disposition: A | Discharge status: Home / Self Care | Source: Ambulatory Visit | Attending: Student | Admitting: Student

## 2024-04-27 DIAGNOSIS — Z1231 Encounter for screening mammogram for malignant neoplasm of breast: Secondary | ICD-10-CM | POA: Diagnosis present

## 2024-04-27 LAB — CBC WITH DIFFERENTIAL/PLATELET
Absolute Lymphocytes: 1511 {cells}/uL (ref 850–3900)
Absolute Monocytes: 535 {cells}/uL (ref 200–950)
Basophils Absolute: 73 {cells}/uL (ref 0–200)
Basophils Relative: 1.1 %
Eosinophils Absolute: 119 {cells}/uL (ref 15–500)
Eosinophils Relative: 1.8 %
HCT: 43 % (ref 35.0–45.0)
Hemoglobin: 13.8 g/dL (ref 11.7–15.5)
MCH: 29.2 pg (ref 27.0–33.0)
MCHC: 32.1 g/dL (ref 32.0–36.0)
MCV: 90.9 fL (ref 80.0–100.0)
MPV: 10.9 fL (ref 7.5–12.5)
Monocytes Relative: 8.1 %
Neutro Abs: 4363 {cells}/uL (ref 1500–7800)
Neutrophils Relative %: 66.1 %
Platelets: 227 Thousand/uL (ref 140–400)
RBC: 4.73 Million/uL (ref 3.80–5.10)
RDW: 12.8 % (ref 11.0–15.0)
Total Lymphocyte: 22.9 %
WBC: 6.6 Thousand/uL (ref 3.8–10.8)

## 2024-04-27 LAB — COMPLETE METABOLIC PANEL WITHOUT GFR
AG Ratio: 1.8 (calc) (ref 1.0–2.5)
ALT: 14 U/L (ref 6–29)
AST: 14 U/L (ref 10–35)
Albumin: 4.5 g/dL (ref 3.6–5.1)
Alkaline phosphatase (APISO): 80 U/L (ref 37–153)
BUN: 18 mg/dL (ref 7–25)
CO2: 28 mmol/L (ref 20–32)
Calcium: 9.6 mg/dL (ref 8.6–10.4)
Chloride: 104 mmol/L (ref 98–110)
Creat: 0.79 mg/dL (ref 0.60–0.95)
Globulin: 2.5 g/dL (ref 1.9–3.7)
Glucose, Bld: 85 mg/dL (ref 65–99)
Potassium: 4.1 mmol/L (ref 3.5–5.3)
Sodium: 140 mmol/L (ref 135–146)
Total Bilirubin: 0.7 mg/dL (ref 0.2–1.2)
Total Protein: 7 g/dL (ref 6.1–8.1)

## 2024-04-27 LAB — TSH: TSH: 1.3 m[IU]/L (ref 0.40–4.50)

## 2024-04-27 LAB — VITAMIN D 25 HYDROXY (VIT D DEFICIENCY, FRACTURES): Vit D, 25-Hydroxy: 63 ng/mL (ref 30–100)

## 2024-04-27 LAB — HEMOGLOBIN A1C
Hgb A1c MFr Bld: 6 % — ABNORMAL HIGH (ref <5.7)
Mean Plasma Glucose: 126 mg/dL
eAG (mmol/L): 7 mmol/L

## 2024-04-27 LAB — LIPID PANEL
Cholesterol: 170 mg/dL (ref <200)
HDL: 52 mg/dL (ref >=50)
LDL Cholesterol (Calc): 98 mg/dL
Non-HDL Cholesterol (Calc): 118 mg/dL (ref <130)
Total CHOL/HDL Ratio: 3.3 (calc) (ref <5.0)
Triglycerides: 106 mg/dL (ref <150)

## 2024-04-27 LAB — VITAMIN B12: Vitamin B-12: 765 pg/mL (ref 200–1100)

## 2024-04-30 ENCOUNTER — Ambulatory Visit: Admitting: Student

## 2024-04-30 ENCOUNTER — Ambulatory Visit: Payer: Medicare PPO | Admitting: Student

## 2024-04-30 ENCOUNTER — Ambulatory Visit: Payer: Self-pay | Admitting: Student

## 2024-04-30 ENCOUNTER — Encounter: Payer: Self-pay | Admitting: Student

## 2024-04-30 VITALS — BP 136/72 | HR 66 | Temp 98.0°F | Ht 64.0 in | Wt 130.4 lb

## 2024-04-30 DIAGNOSIS — R7303 Prediabetes: Secondary | ICD-10-CM | POA: Diagnosis not present

## 2024-04-30 DIAGNOSIS — N812 Incomplete uterovaginal prolapse: Secondary | ICD-10-CM | POA: Diagnosis not present

## 2024-04-30 DIAGNOSIS — Z780 Asymptomatic menopausal state: Secondary | ICD-10-CM

## 2024-04-30 DIAGNOSIS — Z0000 Encounter for general adult medical examination without abnormal findings: Secondary | ICD-10-CM

## 2024-04-30 DIAGNOSIS — M7062 Trochanteric bursitis, left hip: Secondary | ICD-10-CM | POA: Diagnosis not present

## 2024-04-30 NOTE — Progress Notes (Signed)
 [facility] Provider: ABDUL  Code Status: Full Code Goals of Care:     03/05/2024    9:20 AM  Advanced Directives  Does Patient Have a Medical Advance Directive? Yes  Type of Estate agent of [place];Out of facility DNR (pink MOST or yellow form);Living will  Does patient want to make changes to medical advance directive? No - Patient declined  Copy of Healthcare Power of Attorney in Chart? No - copy requested     Chief Complaint  Patient presents with   Medicare Wellness    AWV    HPI: Patient is a 83 y.o. female seen today for medical management of chronic diseases.   Discussed the use of AI scribe software for clinical note transcription with the patient, who gave verbal consent to proceed.  History of Present Illness   Renee Chen is an 83 year old female who presents for an annual wellness visit and to discuss lab results.  Her kidney function is stable with a creatinine level of 0.79, and her sodium and potassium levels are normal at 140 and 4.1, respectively. Liver function tests, including albumin, alkaline phosphatase, AST, and ALT, are all within normal limits. Vitamin D  level is 60, and vitamin B12 is 765, both within desired ranges.  Total cholesterol is 170, HDL is elevated, triglycerides are 106, and LDL is 98. Prior to statin use, her cholesterol levels were much higher, and she experienced minimal change with previous statin use.  Thyroid function test (TSH) is 1.3, within the normal range. Hemoglobin A1c is 6.0. She has a preference for sweets, which may contribute to this level.  Complete blood count is normal, with a white blood cell count of 6.6, hemoglobin at 13.8, and platelets within normal range.  She has a history of osteoporosis, particularly in the hip, and attributes her bone health to regular walking. Her last DEXA scan was in 2020, and she is frustrated with the inability to  compare results due to different machines used in past scans.  She regularly attends eye doctor appointments and dental check-ups twice a year. She has not had any falls in the past year and feels safe at home. She drives independently and does not rely on others for understanding her medical needs.  She has a family history of breast cancer, as her mother died from the disease. She has dense breasts and is diligent about regular mammograms, with the last one in July.         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   Osteoporosis    Presence of pessary    Pure hypercholesterolemia    Urinary incontinence     Past Surgical History:  Procedure Laterality Date   BREAST BIOPSY Left    neg   BREAST EXCISIONAL BIOPSY Left    CATARACT EXTRACTION     COLONOSCOPY     COLONOSCOPY WITH PROPOFOL  N/A 08/04/2019   Procedure: COLONOSCOPY WITH PROPOFOL ;  Surgeon: Toledo, Ladell POUR, MD;  Location: [facility] ENDOSCOPY;  Service: Gastroenterology;  Laterality: N/A;   TUBAL LIGATION      No Known Allergies  Outpatient Encounter Medications as of 04/30/2024  Medication Sig   atorvastatin  (LIPITOR) 10 MG tablet TAKE ONE TABLET BY MOUTH ONCE DAILY   Biotin w/ Vitamins  C & E (HAIR SKIN & NAILS GUMMIES PO) Take 2 each by mouth daily.   Cholecalciferol (VITAMIN D3) 25 MCG (1000 UT) CAPS Take 2,000 Units by mouth daily.   Coenzyme Q10 100 MG capsule Take 300 mg by mouth daily.   Collagen-Vitamin C-Biotin (COLLAGEN PO) Take 1 tablet by mouth daily.   Multiple Vitamins-Minerals (CENTRUM ULTRA WOMENS PO) Take by mouth.   Wheat Dextrin (EQ FIBER POWDER PO) Use daily   No facility-administered encounter medications on file as of 04/30/2024.    Review of Systems:  Review of Systems  Health Maintenance  Topic Date Due   COVID-19 Vaccine (10 - 2024-25 season) 11/04/2023   INFLUENZA VACCINE  05/14/2024    Medicare Annual Wellness (AWV)  04/30/2025   DTaP/Tdap/Td (2 - Td or Tdap) 02/25/2028   Pneumococcal Vaccine: 50+ Years  Completed   DEXA SCAN  Completed   Zoster Vaccines- Shingrix  Completed   Hepatitis B Vaccines  Aged Out   HPV VACCINES  Aged Out   Meningococcal B Vaccine  Aged Out    Physical Exam: Vitals:   04/30/24 0808  BP: 136/72  Pulse: 66  Temp: 98 F (36.7 C)  SpO2: 97%  Weight: 130 lb 6.4 oz (59.1 kg)  Height: 5' 4 (1.626 m)   Body mass index is 22.38 kg/m. Physical Exam Constitutional:      Appearance: Normal appearance.  Pulmonary:     Effort: Pulmonary effort is normal.  Neurological:     Mental Status: She is alert and oriented to person, place, and time.     Gait: Gait normal.  Psychiatric:        Mood and Affect: Mood normal.    Labs reviewed: Basic Metabolic Panel: Recent Labs    10/20/23 0740 04/26/24 0750  NA 140 140  K 4.8 4.1  CL 105 104  CO2 29 28  GLUCOSE 93 85  BUN 13 18  CREATININE 0.81 0.79  CALCIUM  9.8 9.6  TSH  --  1.30   Liver Function Tests: Recent Labs    10/20/23 0740 04/26/24 0750  AST 12 14  ALT 12 14  BILITOT 0.7 0.7  PROT 7.0 7.0   No results for input(s): LIPASE, AMYLASE in the last 8760 hours. No results for input(s): AMMONIA in the last 8760 hours. CBC: Recent Labs    10/20/23 0740 04/26/24 0750  WBC 7.1 6.6  NEUTROABS 4,899 4,363  HGB 14.2 13.8  HCT 43.5 43.0  MCV 93.1 90.9  PLT 223 227   Lipid Panel: Recent Labs    04/26/24 0750  CHOL 170  HDL 52  LDLCALC 98  TRIG 106  CHOLHDL 3.3   Lab Results  Component Value Date   HGBA1C 6.0 (H) 04/26/2024    Procedures since last visit: MM 3D SCREENING MAMMOGRAM BILATERAL BREAST Result Date: 04/30/2024 CLINICAL DATA:  Screening. EXAM: DIGITAL SCREENING BILATERAL MAMMOGRAM WITH TOMOSYNTHESIS AND CAD TECHNIQUE: Bilateral screening digital craniocaudal and mediolateral oblique mammograms were obtained. Bilateral screening digital breast  tomosynthesis was performed. The images were evaluated with computer-aided detection. COMPARISON:  Previous exam(s). ACR Breast Density Category b: There are scattered areas of fibroglandular density. FINDINGS: There are no findings suspicious for malignancy. IMPRESSION: No mammographic evidence of malignancy. A result letter of this screening mammogram will be mailed directly to the patient. RECOMMENDATION: Screening mammogram in one year. (Code:SM-B-01Y) BI-RADS CATEGORY  1: Negative. Electronically Signed   By: Dina  Arceo M.D.   On: 04/30/2024 09:42   Results  LABS   Creatinine: 0.79   Sodium: 140   Potassium: 4.1   Albumin: 4.5   Alkaline Phosphatase: 80   AST: 14   ALT: 14   Vitamin D : 60   Vitamin B12: 765   Total Cholesterol: 170   Triglycerides: 106   LDL: 98   TSH: 1.3   HbA1c: 6.0   WBC: 6.6   Hemoglobin: 13.8        Assessment/Plan    Osteoporosis Osteoporosis, particularly in the hip, has improved to osteopenia range since 2001, attributed to regular walking and physical activity. Last DEXA scan was in 2020. - Order DEXA scan to assess current bone density.  Prediabetes A1c is 6.0, indicating prediabetes (5.7-6.4). No medication required at this level. Discussed potential relationship between age, pancreatic function, and prediabetes, though no clear correlation established. Emphasized lifestyle modifications, such as reducing concentrated sweets, while maintaining joy in life. - Continue current lifestyle and dietary habits. - Consider reducing intake of concentrated sweets if desired.  General Health Maintenance Discussed general health maintenance, including vaccinations, eye exams, and dental visits. Emphasized importance of regular screenings and vaccinations. - Administer COVID-19 vaccine as she has not received the latest dose. - Ensure regular eye exams and dental visits are maintained.          Labs/tests ordered:  * No order type specified * Next  appt:  Visit date not found

## 2024-04-30 NOTE — Progress Notes (Signed)
 Subjective:   Renee Chen is a 83 y.o. female who presents for Medicare Annual (Subsequent) preventive examination.  Visit Complete: In person  Patient Medicare AWV questionnaire was completed by the patient on today; I have confirmed that all information answered by patient is correct and no changes since this date.        Objective:    Today's Vitals   04/30/24 0808  BP: 136/72  Pulse: 66  Temp: 98 F (36.7 C)  SpO2: 97%  Weight: 130 lb 6.4 oz (59.1 kg)  Height: 5' 4 (1.626 m)   Body mass index is 22.38 kg/m.     03/05/2024    9:20 AM 10/17/2023    2:11 PM 08/04/2019    8:01 AM  Advanced Directives  Does Patient Have a Medical Advance Directive? Yes Yes Yes  Type of Estate agent of [place];Out of facility DNR (pink MOST or yellow form);Living will Healthcare Power of [place];Living will Living will  Does patient want to make changes to medical advance directive? No - Patient declined No - Patient declined   Copy of Healthcare Power of Attorney in Chart? No - copy requested No - copy requested     Current Medications (verified) Outpatient Encounter Medications as of 04/30/2024  Medication Sig   atorvastatin  (LIPITOR) 10 MG tablet TAKE ONE TABLET BY MOUTH ONCE DAILY   Biotin w/ Vitamins C & E (HAIR SKIN & NAILS GUMMIES PO) Take 2 each by mouth daily.   Cholecalciferol (VITAMIN D3) 25 MCG (1000 UT) CAPS Take 2,000 Units by mouth daily.   Coenzyme Q10 100 MG capsule Take 300 mg by mouth daily.   Collagen-Vitamin C-Biotin (COLLAGEN PO) Take 1 tablet by mouth daily.   Multiple Vitamins-Minerals (CENTRUM ULTRA WOMENS PO) Take by mouth.   Wheat Dextrin (EQ FIBER POWDER PO) Use daily   No facility-administered encounter medications on file as of 04/30/2024.    Allergies (verified) Patient has no known allergies.   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   Osteoporosis    Presence of pessary    Pure hypercholesterolemia    Urinary incontinence    Past Surgical History:  Procedure Laterality Date   BREAST BIOPSY Left    neg   BREAST EXCISIONAL BIOPSY Left    CATARACT EXTRACTION     COLONOSCOPY     COLONOSCOPY WITH PROPOFOL  N/A 08/04/2019   Procedure: COLONOSCOPY WITH PROPOFOL ;  Surgeon: Toledo, Ladell POUR, MD;  Location: [facility] ENDOSCOPY;  Service: Gastroenterology;  Laterality: N/A;   TUBAL LIGATION     Family History  Problem Relation Age of Onset   Cancer Mother    Breast cancer Mother 89   Kidney failure Father    Social History   Socioeconomic History   Marital status: Married    Spouse name: Not on file   Number of children: Not on file   Years of education: Not on file   Highest education level: Not on file  Occupational History   Occupation: Teacher  Tobacco Use   Smoking status: Never   Smokeless tobacco: Never  Vaping Use   Vaping status: Never Used  Substance and Sexual Activity   Alcohol use: Not Currently   Drug use: Never   Sexual activity: Not on file  Other Topics Concern   Not  on file  Social History Narrative   Not on file   Social Drivers of Health   Financial Resource Strain: Low Risk  (04/21/2023)   [facility]   Overall Financial Resource Strain (CARDIA)    Difficulty of Paying Living Expenses: Not hard at all  Food Insecurity: No Food Insecurity (03/05/2024)   Hunger Vital Sign    Worried About Running Out of Food in the Last Year: Never true    Ran Out of Food in the Last Year: Never true  Transportation Needs: No Transportation Needs (03/05/2024)   PRAPARE - Administrator, Civil Service (Medical): No    Lack of Transportation (Non-Medical): No  Physical Activity: Not on file  Stress: Not on file  Social Connections: Not on file    Tobacco Counseling Counseling given: Not Answered   Clinical Intake:                         Activities of Daily Living     No data to display           Patient Care Team: Abdul Fine, MD as PCP - General (Family Medicine) Verdon Keen, MD as Consulting Physician (Obstetrics and Gynecology) [place]  [facility] Royetta Age, [place] as Referring Physician (Optometry)  Indicate any recent Medical Services you may have received from other than [facility] providers in the past year (date may be approximate).     Assessment:   This is a routine wellness examination for Renee Chen.  Hearing/Vision screen Vision Screening - Comments:: [facility] Last Exam: 2024   Goals Addressed   None    Depression Screen    04/30/2024    8:11 AM 04/23/2024    9:06 AM 03/05/2024    9:20 AM 10/17/2023    2:11 PM  PHQ 2/9 Scores  PHQ - 2 Score 0 0 0 0    Fall Risk    04/30/2024    8:11 AM 04/23/2024    9:06 AM 03/05/2024    9:19 AM 10/17/2023    2:11 PM  Fall Risk   Falls in the past year? 0 0 0 0  Number falls in past yr: 0 0 0 0  Injury with Fall? 0 0 0 0  Risk for fall due to : No Fall Risks No Fall Risks No Fall Risks   Follow up Falls evaluation completed Falls evaluation completed Falls evaluation completed     MEDICARE RISK AT HOME:    TIMED UP AND GO:  Was the test performed?  Yes  Length of time to ambulate 10 feet: not performed  Cognitive Function:        04/30/2024    8:11 AM  6CIT Screen  What Year? 0 points  What month? 0 points  What time? 0 points  Count back from 20 0 points  Months in reverse 0 points  Repeat phrase 0 points  Total Score 0 points    Immunizations Immunization History  Administered Date(s) Administered   Influenza Inj Mdck Quad Pf 07/27/2020   Influenza,inj,Quad PF,6+ Mos 08/15/2016, 08/07/2017, 08/03/2018, 08/05/2019, 08/14/2022   Influenza-Unspecified 08/28/2023   Moderna Covid-19 Vaccine Bivalent Booster 70yrs & up 08/02/2021   PFIZER Comirnaty(Gray Top)Covid-19 Tri-Sucrose  Vaccine 10/21/2019, 11/11/2019   PFIZER(Purple Top)SARS-COV-2 Vaccination 10/21/2019, 11/11/2019, 07/06/2020, 02/28/2021   PNEUMOCOCCAL CONJUGATE-20 06/18/2023   Pfizer Covid-19 Vaccine Bivalent Booster 40yrs & up 10/17/2022, 09/09/2023   Pneumococcal Conjugate-13 12/26/2015  Pneumococcal Polysaccharide-23 10/14/2013   RSV,unspecified 08/28/2022   Rsv, Bivalent, Protein Subunit Rsvpref,pf [place]) 08/28/2022   Tdap 02/24/2018   Zoster Recombinant(Shingrix) 03/27/2020, 08/10/2020   Zoster, Live 10/14/2013    TDAP status: Up to date  Flu Vaccine status: Up to date  Pneumococcal vaccine status: Up to date  Covid-19 vaccine status: Information provided on how to obtain vaccines.   Qualifies for Shingles Vaccine? Yes   Zostavax completed Yes   Shingrix Completed?: Yes  Screening Tests Health Maintenance  Topic Date Due   COVID-19 Vaccine (10 - 2024-25 season) 11/04/2023   INFLUENZA VACCINE  05/14/2024   Medicare Annual Wellness (AWV)  04/30/2025   DTaP/Tdap/Td (2 - Td or Tdap) 02/25/2028   Pneumococcal Vaccine: 50+ Years  Completed   DEXA SCAN  Completed   Zoster Vaccines- Shingrix  Completed   Hepatitis B Vaccines  Aged Out   HPV VACCINES  Aged Out   Meningococcal B Vaccine  Aged Out    Health Maintenance  Health Maintenance Due  Topic Date Due   COVID-19 Vaccine (10 - 2024-25 season) 11/04/2023    Colorectal cancer screening: No longer required.   Mammogram status: Completed 04/29/2024. Repeat every year  Bone Density status: Completed 2020. Results reflect: Bone density results: OSTEOPENIA. Repeat every 5 years.  Lung Cancer Screening: (Low Dose CT Chest recommended if Age 50-80 years, 20 pack-year currently smoking OR have quit w/in 15years.) does not qualify.   Lung Cancer Screening Referral: NA  Additional Screening:  Hepatitis C Screening: does qualify; Completed   Vision Screening: Recommended annual ophthalmology exams for early detection of glaucoma  and other disorders of the eye. Is the patient up to date with their annual eye exam?  Yes  Who is the provider or what is the name of the office in which the patient attends annual eye exams? [facility], patient does not recall name.  If pt is not established with a provider, would they like to be referred to a provider to establish care? No .   Dental Screening: Recommended annual dental exams for proper oral hygiene  Diabetic Foot Exam: NA  Community Resource Referral / Chronic Care Management: CRR required this visit?  No   CCM required this visit?  No     Plan:     I have personally reviewed and noted the following in the patient's chart:   Medical and social history Use of alcohol, tobacco or illicit drugs  Current medications and supplements including opioid prescriptions. Patient is not currently taking opioid prescriptions. Functional ability and status Nutritional status Physical activity Advanced directives List of other physicians Hospitalizations, surgeries, and ER visits in previous 12 months Vitals Screenings to include cognitive, depression, and falls Referrals and appointments In addition, I have reviewed and discussed with patient certain preventive protocols, quality metrics, and best practice recommendations. A written personalized care plan for preventive services as well as general preventive health recommendations were provided to patient.     Richerd Brigham, MD   04/30/2024   After Visit Summary: (In Person-Printed) AVS printed and given to the patient  Nurse Notes: reviewed

## 2024-04-30 NOTE — Patient Instructions (Signed)
Fat and Cholesterol Restricted Eating Plan Eating a diet that limits fat and cholesterol may help lower your risk for heart disease and other conditions. Your body needs fat and cholesterol for basic functions, but eating too much of these things can be harmful to your health. Your health care provider may order lab tests to check your blood fat (lipid) and cholesterol levels. This helps your health care provider understand your risk for certain conditions and whether you need to make diet changes. Work with your health care provider or dietitian to make an eating plan that is right for you. Your plan includes: Limit your fat intake to ______% or less of your total calories a day. This is ______g of fat per day. Limit your saturated fat intake to ______% or less of your total calories a day. This is ______g of saturated fat per day. Limit the amount of cholesterol in your diet to less than _________mg a day. Eat ___________ g of fiber a day. What are tips for following this plan? General guidelines If you are overweight, work with your health care provider to lose weight safely. Losing just 5-10% of your body weight can improve your overall health and help prevent diseases such as diabetes and heart disease. Avoid: Foods with added sugar. Fried foods. Foods that contain partially hydrogenated oils, including stick margarine, some tub margarines, cookies, crackers, and other baked goods. If you drink alcohol: Limit how much you have to: 0-1 drink a day for women who are not pregnant. 0-2 drinks a day for men. Know how much alcohol is in a drink. In the [place] one drink equals one 12 oz bottle of beer (355 mL), one 5 oz glass of wine (148 mL), or one 1 oz glass of hard liquor (44 mL). Reading food labels Check food labels for: Trans fats or partially hydrogenated oils. Avoid foods that contain these. High amounts of saturated fat. Choose foods that are low in saturated fat (less than 2 g). The  amount of cholesterol in each serving. The amount of fiber in each serving. Choose foods with healthy fats, such as: Monounsaturated and polyunsaturated fats. These include olive and canola oil, flaxseeds, walnuts, almonds, and seeds. Omega-3 fats. These are found in foods such as salmon, mackerel, sardines, tuna, flaxseed oil, and ground flaxseeds. Choose grain products that have whole grains. Look for the word "whole" as the first word in the ingredient list. Cooking Cook foods using methods other than frying. Baking, boiling, grilling, and broiling are some healthy options. Eat more home-cooked food and less restaurant, buffet, and fast food. Avoid cooking using saturated fats. Animal sources of saturated fats include meats, butter, and cream. Plant sources of saturated fats include palm oil, palm kernel oil, and coconut oil. Meal planning  At meals, imagine dividing your plate into fourths: Fill one-half of your plate with vegetables, green salads, and fruit. Fill one-fourth of your plate with whole grains. Fill one-fourth of your plate with lean protein foods. Eat fish that is high in omega-3 fats at least two times a week. Eat more foods that contain fiber, such as whole grains, beans, apples, pears, berries, broccoli, carrots, peas, and barley. These foods help promote healthy cholesterol levels in the blood. What foods should I eat? Fruits All fresh, canned (in natural juice), or frozen fruits. Vegetables Fresh or frozen vegetables (raw, steamed, roasted, or grilled). Green salads. Grains Whole grains, such as whole wheat or whole grain breads, crackers, cereals, and pasta. Unsweetened oatmeal, bulgur,  barley, quinoa, or brown rice. Corn or whole wheat flour tortillas. Meats and other proteins Ground beef (85% or leaner), grass-fed beef, or beef trimmed of fat. Skinless chicken or [place]. Ground chicken or [place]. Pork trimmed of fat. All fish and seafood. Egg whites. Dried beans,  peas, or lentils. Unsalted nuts or seeds. Unsalted canned beans. Natural nut butters without added sugar and oil. Dairy Low-fat or nonfat dairy products, such as skim or 1% milk, 2% or reduced-fat cheeses, low-fat and fat-free ricotta or cottage cheese, or plain low-fat and nonfat yogurt. Fats and oils Tub margarine without trans fats. Light or reduced-fat mayonnaise and salad dressings. Avocado. Olive, canola, sesame, or safflower oils. The items listed above may not be a complete list of foods and beverages you can eat. Contact a dietitian for more information. What foods should I avoid? Fruits Canned fruit in heavy syrup. Fruit in cream or butter sauce. Fried fruit. Vegetables Vegetables cooked in cheese, cream, or butter sauce. Fried vegetables. Grains White bread. White pasta. White rice. Cornbread. Bagels, pastries, and croissants. Crackers and snack foods that contain trans fat and hydrogenated oils. Meats and other proteins Fatty cuts of meat. Ribs, chicken wings, bacon, sausage, bologna, salami, chitterlings, fatback, hot dogs, bratwurst, and packaged lunch meats. Liver and organ meats. Whole eggs and egg yolks. Chicken and [place] with skin. Fried meat. Dairy Whole or 2% milk, cream, half-and-half, and cream cheese. Whole milk cheeses. Whole-fat or sweetened yogurt. Full-fat cheeses. Nondairy creamers and whipped toppings. Processed cheese, cheese spreads, and cheese curds. Fats and oils Butter, stick margarine, lard, shortening, ghee, or bacon fat. Coconut, palm kernel, and palm oils. Beverages Alcohol. Sugar-sweetened drinks such as sodas, lemonade, and fruit drinks. Sweets and desserts Corn syrup, sugars, honey, and molasses. Candy. Jam and jelly. Syrup. Sweetened cereals. Cookies, pies, cakes, donuts, muffins, and ice cream. The items listed above may not be a complete list of foods and beverages you should avoid. Contact a dietitian for more information. Summary Your body needs  fat and cholesterol for basic functions. However, eating too much of these things can be harmful to your health. Work with your health care provider and dietitian to follow a diet that limits fat and cholesterol. Doing this may help lower your risk for heart disease and other conditions. Choose healthy fats, such as monounsaturated and polyunsaturated fats, and foods high in omega-3 fatty acids. Eat fiber-rich foods, such as whole grains, beans, peas, fruits, and vegetables. Limit or avoid alcohol, fried foods, and foods high in saturated fats, partially hydrogenated oils, and sugar. This information is not intended to replace advice given to you by your health care provider. Make sure you discuss any questions you have with your health care provider. Document Revised: 02/09/2021 Document Reviewed: 02/09/2021 Elsevier Patient Education  2024 [employer].

## 2024-05-14 ENCOUNTER — Ambulatory Visit: Admitting: Podiatry

## 2024-05-14 ENCOUNTER — Ambulatory Visit

## 2024-05-14 ENCOUNTER — Encounter: Payer: Self-pay | Admitting: Podiatry

## 2024-05-14 VITALS — Ht 64.0 in | Wt 130.4 lb

## 2024-05-14 DIAGNOSIS — M7752 Other enthesopathy of left foot: Secondary | ICD-10-CM

## 2024-05-14 DIAGNOSIS — M722 Plantar fascial fibromatosis: Secondary | ICD-10-CM | POA: Diagnosis not present

## 2024-05-14 MED ORDER — BETAMETHASONE SOD PHOS & ACET 6 (3-3) MG/ML IJ SUSP
3.0000 mg | Freq: Once | INTRAMUSCULAR | Status: AC
  Administered 2024-05-14: 3 mg via INTRA_ARTICULAR

## 2024-05-14 NOTE — Progress Notes (Signed)
   Chief Complaint  Patient presents with   Foot Pain    Pt is here due to left foot pain that began 3 weeks ago, she states no injury to the foot. She states that her heel is very sore and hard to walk due to this. This has never happen before.    Subjective: 83 y.o. female presenting today for onset of left heel pain for about 3 weeks.  Idiopathic onset.  No history of injury.  She wears good supportive sandals and shoes and does not go barefoot.  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  Dermatology: Skin is warm, dry and supple bilateral lower extremities. Negative for open lesions or macerations bilateral.   Vascular: Dorsalis Pedis and Posterior Tibial pulses palpable bilateral.  Capillary fill time is immediate to all digits.  Neurological: Grossly intact via light touch  Musculoskeletal: Tenderness to palpation to the plantar aspect of the left heel along the plantar fascia. All other joints range of motion within normal limits bilateral. Strength 5/5 in all groups bilateral.   Radiographic exam LT foot 05/14/2024: Normal osseous mineralization. Joint spaces preserved.  No fracture identified.  Posterior heel spurring noted w/ os peroneum also visualized on lateral view  Assessment: 1. Plantar fasciitis left foot  Plan of Care:  -Patient evaluated. Xrays reviewed.   -Injection of 0.5cc Celestone soluspan injected into the left plantar fascia.  -Declined prescription medication -Continue wearing good supportive sandals (Vionics) and tennis shoes.  Refrain from going barefoot -Return to clinic PRN   Thresa EMERSON Sar,  DPM [facility]  Dr. Thresa EMERSON Sar, DPM    [address] [place], [place] [address]                Office [phone]  Fax [phone]

## 2024-05-18 ENCOUNTER — Ambulatory Visit: Admitting: Podiatry

## 2024-07-01 DIAGNOSIS — L853 Xerosis cutis: Secondary | ICD-10-CM | POA: Diagnosis not present

## 2024-07-01 DIAGNOSIS — L814 Other melanin hyperpigmentation: Secondary | ICD-10-CM | POA: Diagnosis not present

## 2024-07-01 DIAGNOSIS — D1801 Hemangioma of skin and subcutaneous tissue: Secondary | ICD-10-CM | POA: Diagnosis not present

## 2024-07-01 DIAGNOSIS — L570 Actinic keratosis: Secondary | ICD-10-CM | POA: Diagnosis not present

## 2024-07-01 DIAGNOSIS — L905 Scar conditions and fibrosis of skin: Secondary | ICD-10-CM | POA: Diagnosis not present

## 2024-07-01 DIAGNOSIS — L821 Other seborrheic keratosis: Secondary | ICD-10-CM | POA: Diagnosis not present

## 2024-07-01 DIAGNOSIS — L820 Inflamed seborrheic keratosis: Secondary | ICD-10-CM | POA: Diagnosis not present

## 2024-07-07 DIAGNOSIS — D485 Neoplasm of uncertain behavior of skin: Secondary | ICD-10-CM | POA: Diagnosis not present

## 2024-08-09 ENCOUNTER — Other Ambulatory Visit: Payer: Self-pay

## 2024-08-09 MED ORDER — ATORVASTATIN CALCIUM 10 MG PO TABS: 10.0000 mg | ORAL_TABLET | Freq: Every day | ORAL | 0 refills | Status: DC

## 2024-08-16 ENCOUNTER — Encounter: Payer: Self-pay | Admitting: Orthopedic Surgery

## 2024-08-16 ENCOUNTER — Non-Acute Institutional Stay: Admitting: Orthopedic Surgery

## 2024-08-16 VITALS — BP 132/78 | HR 71 | Temp 98.3°F | Ht 64.0 in | Wt 129.8 lb

## 2024-08-16 DIAGNOSIS — Z638 Other specified problems related to primary support group: Secondary | ICD-10-CM | POA: Diagnosis not present

## 2024-08-16 DIAGNOSIS — R682 Dry mouth, unspecified: Secondary | ICD-10-CM

## 2024-08-16 DIAGNOSIS — L603 Nail dystrophy: Secondary | ICD-10-CM | POA: Diagnosis not present

## 2024-08-16 DIAGNOSIS — R030 Elevated blood-pressure reading, without diagnosis of hypertension: Secondary | ICD-10-CM | POA: Diagnosis not present

## 2024-08-16 NOTE — Patient Instructions (Addendum)
 Take blood pressures when you feel relaxed, goal pressures < 150/90  Bring blood pressure readings to [facility] when you can   Limit sodium in diet, recommend < 2000 mg daily   Drink at least 40 ounces water daily  Try humidifier at bedtime

## 2024-08-16 NOTE — Progress Notes (Signed)
 Location:   [employer] of Service:   [facility] Provider:  Greig FORBES Cluster, NP   Cluster Greig FORBES, NP  Patient Care Team: Cluster Greig FORBES, NP as PCP - General (Adult Health Nurse Practitioner) Verdon Keen, MD as Consulting Physician (Obstetrics and Gynecology) [facility] Royetta Age, [place] as Referring Physician [facility])  Extended Emergency Contact Information Primary Emergency Contact: Johndrow,Todd Address: [address]          [place], [place] [address] [place] Home Phone: [phone] Relation: Son Secondary Emergency Contact: Hughes,Jill Address: [address]          [place], [place] [address] [place] Mobile Phone: [phone] Relation: Daughter  Code Status:  Full code Goals of care: Advanced Directive information    03/05/2024    9:20 AM  Advanced Directives  Does Patient Have a Medical Advance Directive? Yes  Type of Estate Agent of [place];Out of facility DNR (pink MOST or yellow form);Living will  Does patient want to make changes to medical advance directive? No - Patient declined  Copy of Healthcare Power of Attorney in Chart? No - copy requested     Chief Complaint  Patient presents with   Hypertension    BP running high and Finger Nails wont grow, soft and split.     HPI:  Pt is a 83 y.o. female seen today for acute visit due to elevated blood pressure.   Discussed the use of AI scribe software for clinical note transcription with the patient, who gave verbal consent to proceed.  History of Present Illness   Renee Chen is an 83 year old female with hypertension who presents with concerns about elevated blood pressure readings at home.  Over the past year, she has experienced elevated blood pressure readings at home, particularly in the mornings, with measurements often in the 140s to 150s over 70s. Previously, her readings  averaged 120-130/60-75. She attributes some variability to using an old blood pressure machine with an oversized cuff, which her daughter advised replacing. Since obtaining a new machine, she monitors her blood pressure multiple times a day, noting that afternoon and evening readings are often lower, in the 120s/60s. She denies chest pain, shortness of breath, blurred vision, or persistent headaches.  She has reduced her intake of salty foods, although she finds it challenging to maintain a low-sodium diet. She has been experimenting with various seasonings to improve the taste of her meals without adding salt.  She reports issues with her nails, which have been splitting and are very soft, particularly on her thumbs and two fingers. She has been taking biotin supplements and using collagen in her coffee, which she feels has helped somewhat. She denies excessive water exposure or chemical exposure to her nails.  She experiences dry mouth primarily at night, which she manages with water and dry mouth mouthwash. She questions whether her water intake during the day is sufficient and acknowledges that she may not feel thirsty due to age-related changes. She has used a humidifier in the past to address dry air in her apartment.  She is the primary caregiver for her husband, which she acknowledges is a source of stress. She expresses concern about her husband's cognitive health, noting changes over the past two to three years that suggest possible dementia, although he has not been formally evaluated.        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   Osteoporosis    Presence of pessary    Pure hypercholesterolemia    Urinary incontinence    Past Surgical History:  Procedure Laterality Date   BREAST BIOPSY Left    neg   BREAST EXCISIONAL BIOPSY Left    CATARACT EXTRACTION     COLONOSCOPY      COLONOSCOPY WITH PROPOFOL  N/A 08/04/2019   Procedure: COLONOSCOPY WITH PROPOFOL ;  Surgeon: Toledo, Ladell POUR, MD;  Location: [facility] ENDOSCOPY;  Service: Gastroenterology;  Laterality: N/A;   TUBAL LIGATION      No Known Allergies  Outpatient Encounter Medications as of 08/16/2024  Medication Sig   atorvastatin  (LIPITOR) 10 MG tablet Take 1 tablet (10 mg total) by mouth daily. Future refills to come from new PCP   Cholecalciferol (VITAMIN D3) 25 MCG (1000 UT) CAPS Take 2,000 Units by mouth daily.   Coenzyme Q10 100 MG capsule Take 300 mg by mouth daily.   Collagen-Vitamin C-Biotin (COLLAGEN PO) Take 1 tablet by mouth daily.   docusate sodium (COLACE) 100 MG capsule Take 100 mg by mouth daily.   Multiple Vitamins-Minerals (CENTRUM ULTRA WOMENS PO) Take by mouth.   Biotin w/ Vitamins C & E (HAIR SKIN & NAILS GUMMIES PO) Take 2 each by mouth daily. (Patient not taking: Reported on 08/16/2024)   Wheat Dextrin (EQ FIBER POWDER PO) Use daily (Patient not taking: Reported on 08/16/2024)   No facility-administered encounter medications on file as of 08/16/2024.    Review of Systems  Constitutional: Negative.   HENT:         Dry mouth  Eyes: Negative.   Respiratory: Negative.    Cardiovascular: Negative.   Skin:        Thin nails  Psychiatric/Behavioral:  Negative for dysphoric mood. The patient is not nervous/anxious.        Caregiver stress    Immunization History  Administered Date(s) Administered    sv, Bivalent, Protein Subunit Rsvpref,pf (Abrysvo) 08/28/2022   Influenza Inj Mdck Quad Pf 07/27/2020   Influenza,inj,Quad PF,6+ Mos 08/15/2016, 08/07/2017, 08/03/2018, 08/05/2019, 08/14/2022   Influenza-Unspecified 08/28/2023, 08/06/2024   Moderna Covid-19 Vaccine Bivalent Booster 41yrs & up 08/02/2021   PFIZER Comirnaty(Gray Top)Covid-19 Tri-Sucrose Vaccine 10/21/2019, 11/11/2019   PFIZER(Purple Top)SARS-COV-2 Vaccination 10/21/2019, 11/11/2019, 07/06/2020, 02/28/2021   PNEUMOCOCCAL  CONJUGATE-20 06/18/2023   Pfizer Covid-19 Vaccine Bivalent Booster 28yrs & up 10/17/2022, 09/09/2023   Pneumococcal Conjugate-13 12/26/2015   Pneumococcal Polysaccharide-23 10/14/2013   RSV,unspecified 08/28/2022   Tdap 02/24/2018   Unspecified SARS-COV-2 Vaccination 08/06/2024   Zoster Recombinant(Shingrix) 03/27/2020, 08/10/2020   Zoster, Live 10/14/2013   Pertinent  Health Maintenance Due  Topic Date Due   Influenza Vaccine  Completed   DEXA SCAN  Completed      06/15/2022    1:36 PM 10/17/2023    2:11 PM 03/05/2024    9:19 AM 04/23/2024    9:06 AM 04/30/2024    8:11 AM  Fall Risk  Falls in the past year?  0 0 0 0  Was there an injury with Fall?  0 0 0 0  Fall Risk Category Calculator  0 0 0 0  (RETIRED) Patient Fall Risk Level Low fall risk       Patient at Risk for Falls Due to   No Fall Risks No Fall Risks No Fall Risks  Fall  risk Follow up   Falls evaluation completed Falls evaluation completed Falls evaluation completed     Data saved with a previous flowsheet row definition   Functional Status Survey:    Vitals:   08/16/24 1301 08/16/24 1302  BP: (!) 142/84 (!) 155/82  Pulse: 71   Temp: 98.3 F (36.8 C)   SpO2: 98%   Weight: 129 lb 12.8 oz (58.9 kg)   Height: 5' 4 (1.626 m)    Body mass index is 22.28 kg/m. Physical Exam Vitals reviewed.  Constitutional:      General: She is not in acute distress. HENT:     Head: Normocephalic.  Eyes:     General:        Right eye: No discharge.        Left eye: No discharge.  Cardiovascular:     Rate and Rhythm: Normal rate and regular rhythm.     Pulses: Normal pulses.     Heart sounds: Normal heart sounds.  Pulmonary:     Effort: Pulmonary effort is normal.     Breath sounds: Normal breath sounds.  Skin:    Capillary Refill: Capillary refill takes less than 2 seconds.     Comments: Nails short, no fungus, appear thin  Neurological:     General: No focal deficit present.     Mental Status: She is alert and  oriented to person, place, and time.  Psychiatric:        Mood and Affect: Mood normal.     Labs reviewed: Recent Labs    10/20/23 0740 04/26/24 0750  NA 140 140  K 4.8 4.1  CL 105 104  CO2 29 28  GLUCOSE 93 85  BUN 13 18  CREATININE 0.81 0.79  CALCIUM  9.8 9.6   Recent Labs    10/20/23 0740 04/26/24 0750  AST 12 14  ALT 12 14  BILITOT 0.7 0.7  PROT 7.0 7.0   Recent Labs    10/20/23 0740 04/26/24 0750  WBC 7.1 6.6  NEUTROABS 4,899 4,363  HGB 14.2 13.8  HCT 43.5 43.0  MCV 93.1 90.9  PLT 223 227   Lab Results  Component Value Date   TSH 1.30 04/26/2024   Lab Results  Component Value Date   HGBA1C 6.0 (H) 04/26/2024   Lab Results  Component Value Date   CHOL 170 04/26/2024   HDL 52 04/26/2024   LDLCALC 98 04/26/2024   TRIG 106 04/26/2024   CHOLHDL 3.3 04/26/2024    Significant Diagnostic Results in last 30 days:  No results found.  Assessment/Plan 1. Transient elevated blood pressure (Primary) - ongoing - home pressures < 150/90 - asymptomatic - trying to follow no salt added diet - BUN/creat 18/0.79 04/26/2024 - unable to upload home pressures> will bring copy when she can - admits to stress caring for husband - cont home blood pressures> take when not stressed or in pain  2. Thin nails - unsuccessful trial biotene supplement - TSH normal - avid prolonged water or chemical exposure to hands  - cont collagen supplement  3. Dry mouth - ? Mouth breathing at night - cont Biotene - do not suspect sjrogrens due to age - try humidifier at night   4. Caregiver role strain - concerned husband having memory issues - has supportive daughter - no depression     Scientist, forensic Communication: plan discussed with patient  Labs/tests ordered:  bring home blood pressures

## 2024-08-20 ENCOUNTER — Ambulatory Visit: Admitting: Student

## 2024-10-15 ENCOUNTER — Encounter: Payer: Self-pay | Admitting: Orthopedic Surgery

## 2024-10-15 ENCOUNTER — Non-Acute Institutional Stay: Admitting: Orthopedic Surgery

## 2024-10-15 ENCOUNTER — Ambulatory Visit: Payer: Self-pay

## 2024-10-15 VITALS — BP 138/78 | HR 63 | Temp 98.0°F | Ht 64.0 in | Wt 127.6 lb

## 2024-10-15 DIAGNOSIS — M25511 Pain in right shoulder: Secondary | ICD-10-CM | POA: Diagnosis not present

## 2024-10-15 DIAGNOSIS — Z638 Other specified problems related to primary support group: Secondary | ICD-10-CM | POA: Diagnosis not present

## 2024-10-15 DIAGNOSIS — M5432 Sciatica, left side: Secondary | ICD-10-CM

## 2024-10-15 MED ORDER — PREDNISONE 20 MG PO TABS: ORAL_TABLET | ORAL | 0 refills | Status: AC

## 2024-10-15 NOTE — Telephone Encounter (Signed)
 See triage notes from triage nurse.

## 2024-10-15 NOTE — Patient Instructions (Addendum)
 Light walking for sciatica pain> listen to your body about amount of walking  Start prednisone for back pain> always take in the morning   Continue tylenol [phone] mg 1-3 times daily as needed for pain> ok if you have to take a dose every night   [facility] neurology> [phone]

## 2024-10-15 NOTE — Telephone Encounter (Signed)
 FYI Only or Action Required?: FYI only for provider: appointment scheduled on 10/15/2024 at 10:40 AM.  Patient was last seen in primary care on 08/16/2024 by Gil Greig BRAVO, NP.  Called Nurse Triage reporting Leg Pain.  Symptoms began 4 days ago.  Interventions attempted: Rest, hydration, or home remedies.  Symptoms are: unchanged.  Triage Disposition: See PCP When Office is Open (Within 3 Days)  Patient/caregiver understands and will follow disposition?: Yes  Copied from CRM #[id number]. Topic: Clinical - Red Word Triage >> Oct 15, 2024  8:43 AM Graeme ORN wrote: Red Word that prompted transfer to Nurse Triage: Pain going down left leg - worse at night - unable to sleep.    ----------------------------------------------------------------------- From previous Reason for Contact - Scheduling: Patient/patient representative is calling to schedule an appointment. Refer to attachments for appointment information. Reason for Disposition  [1] MODERATE pain (e.g., interferes with normal activities, limping) AND [2] present > 3 days  Answer Assessment - Initial Assessment Questions Reports pain to left leg that started four days ago. Patient is more severe at night. Current pain is mild. Patient is requesting to be seen at [facility].  1. ONSET: When did the pain start?      Started 4 days ago 2. LOCATION: Where is the pain located?      Left leg 3. PAIN: How bad is the pain?    (Scale 1-10; or mild, moderate, severe)     Pain is worse at night. Pain is mild currently 4. WORK OR EXERCISE: Has there been any recent work or exercise that involved this part of the body?      No but had a lower back pain issue 5. CAUSE: What do you think is causing the leg pain?     Thinks might be sciatica related  6. OTHER SYMPTOMS: Do you have any other symptoms? (e.g., chest pain, back pain, breathing difficulty, swelling, rash, fever, numbness, weakness)     Some numbness at night to her  toes.  Protocols used: Leg Pain-A-AH

## 2024-10-15 NOTE — Progress Notes (Signed)
 " Location:  Other Nursing Home Room Number: [facility] of Service:  Clinic ([phone]) Provider:  Greig FORBES Cluster, NP   Cluster Greig FORBES, NP  Patient Care Team: Cluster Greig FORBES, NP as PCP - General (Adult Health Nurse Practitioner) Verdon Keen, MD as Consulting Physician (Obstetrics and Gynecology) [facility] Royetta Age, [place] as Referring Physician [facility])  Extended Emergency Contact Information Primary Emergency Contact: Nakanishi,Todd Address: [address]          [place], [place] [address] [place] Home Phone: [phone] Relation: Son Secondary Emergency Contact: Hughes,Jill Address: [address]          [place], [place] [address] [place] Mobile Phone: [phone] Relation: Daughter  Code Status:  Full code Goals of care: Advanced Directive information    03/05/2024    9:20 AM  Advanced Directives  Does Patient Have a Medical Advance Directive? Yes  Type of Estate Agent of [place];Out of facility DNR (pink MOST or yellow form);Living will  Does patient want to make changes to medical advance directive? No - Patient declined  Copy of Healthcare Power of Attorney in Chart? No - copy requested     Chief Complaint  Patient presents with   Leg Pain    Left Leg Pain, Hip to shin since 12/27. No relief with Tylenol. Wakes her up at night.     HPI:  Pt is a 84 y.o. female seen today for acute visit due to shoulder and back pain.   Discussed the use of AI scribe software for clinical note transcription with the patient, who gave verbal consent to proceed.  History of Present Illness   Renee Chen is an 84 year old female with sciatica and arthritis who presents with back pain radiating down her leg.  She has been experiencing back pain radiating from her lower back down through the muscle and bone in the front of her leg for approximately four days. The pain is  particularly severe at night, waking her up around midnight, but eases after a warm shower and movement in the morning. She uses a heating pad at night on her lower leg, which she describes as the worst area of pain. She also reports numbness in her left toe, which she associates with nerve involvement.  She has a history of arthritis and previously experienced shoulder pain in the spring, which had improved but has recently returned due to assisting her husband with dressing and compression socks. Home care assistance has alleviated some of the physical strain, improving her shoulder pain.  Her current medications and treatments include Tylenol. She typically walks four to five miles a day but has reduced this due to fear of exacerbating her condition.       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   Osteoporosis    Presence of pessary    Pure hypercholesterolemia    Urinary incontinence    Past Surgical History:  Procedure Laterality Date   BREAST BIOPSY Left    neg   BREAST EXCISIONAL BIOPSY Left    CATARACT EXTRACTION     COLONOSCOPY     COLONOSCOPY WITH PROPOFOL  N/A 08/04/2019   Procedure: COLONOSCOPY WITH PROPOFOL ;  Surgeon: Toledo, Ladell POUR, MD;  Location: [facility] ENDOSCOPY;  Service:  Gastroenterology;  Laterality: N/A;   TUBAL LIGATION      Allergies[1]  Outpatient Encounter Medications as of 10/15/2024  Medication Sig   atorvastatin  (LIPITOR) 10 MG tablet Take 1 tablet (10 mg total) by mouth daily. Future refills to come from new PCP   Cholecalciferol (VITAMIN D3) 25 MCG (1000 UT) CAPS Take 2,000 Units by mouth daily.   Coenzyme Q10 100 MG capsule Take 300 mg by mouth daily.   Collagen-Vitamin C-Biotin (COLLAGEN PO) Take 1 tablet by mouth daily.   docusate sodium (COLACE) 100 MG capsule Take 100 mg by mouth daily.   Multiple Vitamins-Minerals (CENTRUM ULTRA WOMENS PO)  Take by mouth.   Wheat Dextrin (EQ FIBER POWDER PO) Use daily (Patient not taking: Reported on 10/15/2024)   No facility-administered encounter medications on file as of 10/15/2024.    Review of Systems  Constitutional:  Negative for fatigue and fever.  HENT:  Negative for sore throat and trouble swallowing.   Eyes:  Negative for visual disturbance.  Respiratory:  Negative for cough and shortness of breath.   Cardiovascular:  Negative for chest pain and leg swelling.  Gastrointestinal:  Negative for abdominal distention and abdominal pain.  Genitourinary:  Negative for dysuria and hematuria.  Musculoskeletal:  Positive for arthralgias, back pain and gait problem.  Skin:  Negative for wound.  Neurological:  Negative for dizziness and headaches.  Psychiatric/Behavioral:  Negative for confusion and dysphoric mood. The patient is not nervous/anxious.     Immunization History  Administered Date(s) Administered    sv, Bivalent, Protein Subunit Rsvpref,pf (Abrysvo) 08/28/2022   Influenza Inj Mdck Quad Pf 07/27/2020   Influenza,inj,Quad PF,6+ Mos 08/15/2016, 08/07/2017, 08/03/2018, 08/05/2019, 08/14/2022   Influenza-Unspecified 08/28/2023, 08/06/2024   Moderna Covid-19 Vaccine Bivalent Booster 44yrs & up 08/02/2021   PFIZER Comirnaty(Gray Top)Covid-19 Tri-Sucrose Vaccine 10/21/2019, 11/11/2019   PFIZER(Purple Top)SARS-COV-2 Vaccination 10/21/2019, 11/11/2019, 07/06/2020, 02/28/2021   PNEUMOCOCCAL CONJUGATE-20 06/18/2023   Pfizer Covid-19 Vaccine Bivalent Booster 56yrs & up 10/17/2022, 09/09/2023   Pneumococcal Conjugate-13 12/26/2015   Pneumococcal Polysaccharide-23 10/14/2013   RSV,unspecified 08/28/2022   Tdap 02/24/2018   Unspecified SARS-COV-2 Vaccination 08/06/2024   Zoster Recombinant(Shingrix) 03/27/2020, 08/10/2020   Zoster, Live 10/14/2013   Pertinent  Health Maintenance Due  Topic Date Due   Influenza Vaccine  Completed   Bone Density Scan  Completed      03/05/2024    9:19  AM 04/23/2024    9:06 AM 04/30/2024    8:11 AM 08/16/2024    2:03 PM 10/15/2024   10:49 AM  Fall Risk  Falls in the past year? 0 0 0 0 0  Was there an injury with Fall? 0  0  0  0  0  Fall Risk Category Calculator 0 0 0 0 0  Patient at Risk for Falls Due to No Fall Risks No Fall Risks No Fall Risks No Fall Risks No Fall Risks  Fall risk Follow up Falls evaluation completed Falls evaluation completed Falls evaluation completed Falls evaluation completed Falls evaluation completed     Data saved with a previous flowsheet row definition   Functional Status Survey:    Vitals:   10/15/24 1048  BP: 138/78  Pulse: 63  Temp: 98 F (36.7 C)  SpO2: 99%  Weight: 127 lb 9.6 oz (57.9 kg)  Height: 5' 4 (1.626 m)   Body mass index is 21.9 kg/m. Physical Exam Vitals reviewed.  Constitutional:      General: She is not in acute distress. HENT:  Head: Normocephalic.  Eyes:     General:        Right eye: No discharge.        Left eye: No discharge.  Cardiovascular:     Rate and Rhythm: Normal rate and regular rhythm.     Pulses: Normal pulses.     Heart sounds: Normal heart sounds.  Pulmonary:     Effort: Pulmonary effort is normal.     Breath sounds: Normal breath sounds.  Abdominal:     General: Bowel sounds are normal. There is no distension.     Palpations: Abdomen is soft.     Tenderness: There is no abdominal tenderness.  Musculoskeletal:     Right shoulder: No swelling. Normal range of motion.     Cervical back: Normal and neck supple.     Thoracic back: Normal.     Lumbar back: Normal.     Right lower leg: No edema.     Left lower leg: No edema.  Skin:    General: Skin is warm.     Capillary Refill: Capillary refill takes less than 2 seconds.  Neurological:     General: No focal deficit present.     Mental Status: She is alert and oriented to person, place, and time.  Psychiatric:        Mood and Affect: Mood normal.     Labs reviewed: Recent Labs     10/20/23 0740 04/26/24 0750  NA 140 140  K 4.8 4.1  CL 105 104  CO2 29 28  GLUCOSE 93 85  BUN 13 18  CREATININE 0.81 0.79  CALCIUM  9.8 9.6   Recent Labs    10/20/23 0740 04/26/24 0750  AST 12 14  ALT 12 14  BILITOT 0.7 0.7  PROT 7.0 7.0   Recent Labs    10/20/23 0740 04/26/24 0750  WBC 7.1 6.6  NEUTROABS 4,899 4,363  HGB 14.2 13.8  HCT 43.5 43.0  MCV 93.1 90.9  PLT 223 227   Lab Results  Component Value Date   TSH 1.30 04/26/2024   Lab Results  Component Value Date   HGBA1C 6.0 (H) 04/26/2024   Lab Results  Component Value Date   CHOL 170 04/26/2024   HDL 52 04/26/2024   LDLCALC 98 04/26/2024   TRIG 106 04/26/2024   CHOLHDL 3.3 04/26/2024    Significant Diagnostic Results in last 30 days:  No results found.  Assessment/Plan 1. Sciatica of left side (Primary) - onset x 4 days - no recent injury or fall - h/o trochanteric bursitis - exam unremarkable - cont tylenol prn for pain  - will try prednisone taper for symptoms  - predniSONE (DELTASONE) 20 MG tablet; Take 2 tablets (40 mg total) by mouth daily with breakfast for 5 days, THEN 1 tablet (20 mg total) daily with breakfast for 5 days.  Dispense: 15 tablet; Refill: 0  2. Acute pain of right shoulder - see above - predniSONE (DELTASONE) 20 MG tablet; Take 2 tablets (40 mg total) by mouth daily with breakfast for 5 days, THEN 1 tablet (20 mg total) daily with breakfast for 5 days.  Dispense: 15 tablet; Refill: 0  3. Caregiver role strain - main caregiver for husband with cognitive issues - considering LTC options  - she recently hired home care to help    Family/ staff Communication: plan discussed with patient  Labs/tests ordered:  none       [1] No Known Allergies  "

## 2024-10-24 ENCOUNTER — Encounter: Payer: Self-pay | Admitting: Orthopedic Surgery

## 2024-11-10 ENCOUNTER — Other Ambulatory Visit: Payer: Self-pay | Admitting: Nurse Practitioner
